# Patient Record
Sex: Male | Born: 1945 | Race: White | Hispanic: No | Marital: Married | Smoking: Former smoker
Health system: Southern US, Community
[De-identification: ages and names within clinical notes are randomized; demographics above are authoritative.]

## PROBLEM LIST (undated history)

## (undated) DIAGNOSIS — K649 Unspecified hemorrhoids: Secondary | ICD-10-CM

## (undated) DIAGNOSIS — E7800 Pure hypercholesterolemia, unspecified: Secondary | ICD-10-CM

## (undated) DIAGNOSIS — M51379 Other intervertebral disc degeneration, lumbosacral region without mention of lumbar back pain or lower extremity pain: Secondary | ICD-10-CM

## (undated) DIAGNOSIS — H9190 Unspecified hearing loss, unspecified ear: Secondary | ICD-10-CM

## (undated) DIAGNOSIS — M5137 Other intervertebral disc degeneration, lumbosacral region: Secondary | ICD-10-CM

## (undated) DIAGNOSIS — I10 Essential (primary) hypertension: Secondary | ICD-10-CM

## (undated) HISTORY — DX: Unspecified hearing loss, unspecified ear: H91.90

## (undated) HISTORY — DX: Essential (primary) hypertension: I10

## (undated) HISTORY — DX: Unspecified hemorrhoids: K64.9

## (undated) HISTORY — PX: TONSILLECTOMY: SHX5217

## (undated) HISTORY — DX: Other intervertebral disc degeneration, lumbosacral region: M51.37

## (undated) HISTORY — DX: Other intervertebral disc degeneration, lumbosacral region without mention of lumbar back pain or lower extremity pain: M51.379

## (undated) HISTORY — DX: Pure hypercholesterolemia, unspecified: E78.00

---

## 1997-07-15 HISTORY — PX: TRANSURETHRAL RESECTION OF BLADDER TUMOR: SHX2575

## 1998-03-16 ENCOUNTER — Encounter: Payer: Self-pay | Admitting: Neurosurgery

## 1998-03-16 ENCOUNTER — Ambulatory Visit (HOSPITAL_COMMUNITY): Admission: RE | Admit: 1998-03-16 | Discharge: 1998-03-16 | Payer: Self-pay | Admitting: Neurosurgery

## 1998-03-17 ENCOUNTER — Ambulatory Visit (HOSPITAL_COMMUNITY): Admission: RE | Admit: 1998-03-17 | Discharge: 1998-03-17 | Payer: Self-pay | Admitting: Neurosurgery

## 1998-03-23 ENCOUNTER — Encounter: Payer: Self-pay | Admitting: Neurosurgery

## 1998-03-23 ENCOUNTER — Ambulatory Visit (HOSPITAL_COMMUNITY): Admission: RE | Admit: 1998-03-23 | Discharge: 1998-03-23 | Payer: Self-pay | Admitting: Neurosurgery

## 1998-04-19 ENCOUNTER — Ambulatory Visit (HOSPITAL_COMMUNITY): Admission: RE | Admit: 1998-04-19 | Discharge: 1998-04-19 | Payer: Self-pay | Admitting: Gastroenterology

## 2003-12-20 ENCOUNTER — Ambulatory Visit (HOSPITAL_COMMUNITY): Admission: RE | Admit: 2003-12-20 | Discharge: 2003-12-20 | Payer: Self-pay | Admitting: Neurosurgery

## 2004-07-23 ENCOUNTER — Ambulatory Visit: Payer: Self-pay | Admitting: Cardiology

## 2004-08-13 ENCOUNTER — Ambulatory Visit: Payer: Self-pay | Admitting: Cardiology

## 2004-08-28 ENCOUNTER — Ambulatory Visit: Payer: Self-pay | Admitting: Internal Medicine

## 2005-09-10 ENCOUNTER — Ambulatory Visit: Payer: Self-pay | Admitting: Internal Medicine

## 2005-09-19 ENCOUNTER — Ambulatory Visit: Payer: Self-pay | Admitting: Internal Medicine

## 2005-10-24 ENCOUNTER — Ambulatory Visit: Payer: Self-pay | Admitting: Gastroenterology

## 2005-11-08 ENCOUNTER — Ambulatory Visit: Payer: Self-pay | Admitting: Gastroenterology

## 2006-10-31 ENCOUNTER — Inpatient Hospital Stay (HOSPITAL_COMMUNITY): Admission: EM | Admit: 2006-10-31 | Discharge: 2006-11-03 | Payer: Self-pay | Admitting: Emergency Medicine

## 2006-10-31 IMAGING — CR DG HAND COMPLETE 3+V*L*
3 series · 3 of 3 positions shown · non-contrast
Comparison: none

CLINICAL DATA: 61 year-old-male with cat bite lateral thumb. 
 LEFT HAND ? 3 VIEW:

[x hand pa left]
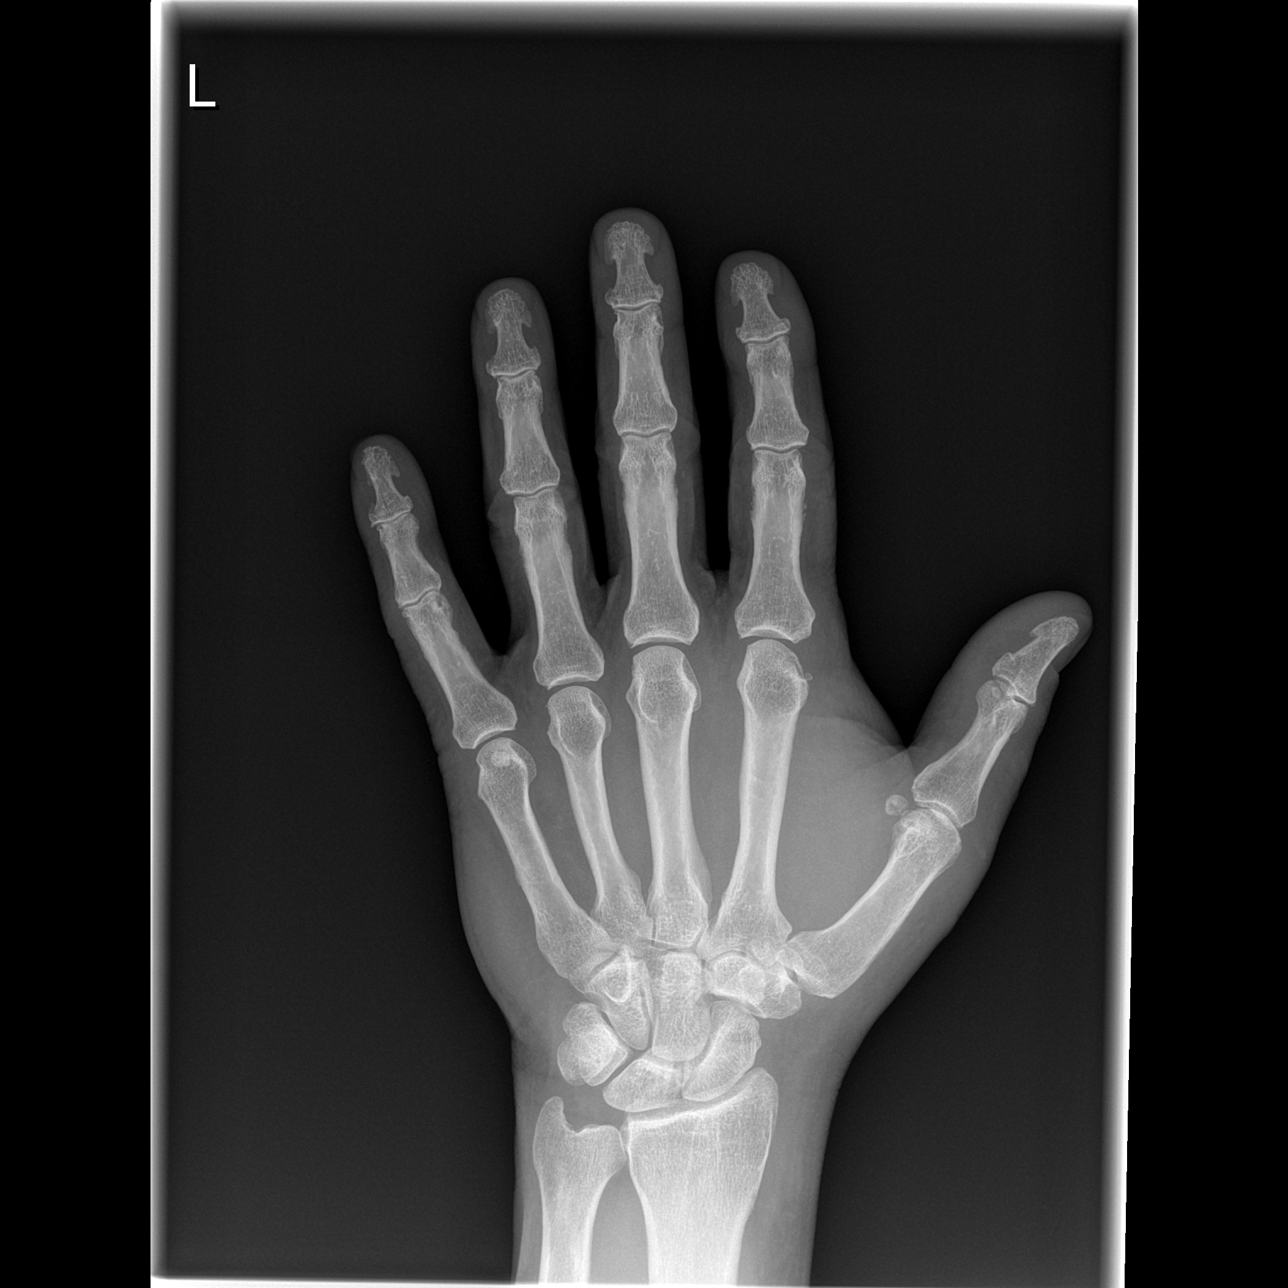

[x hand oblique left]
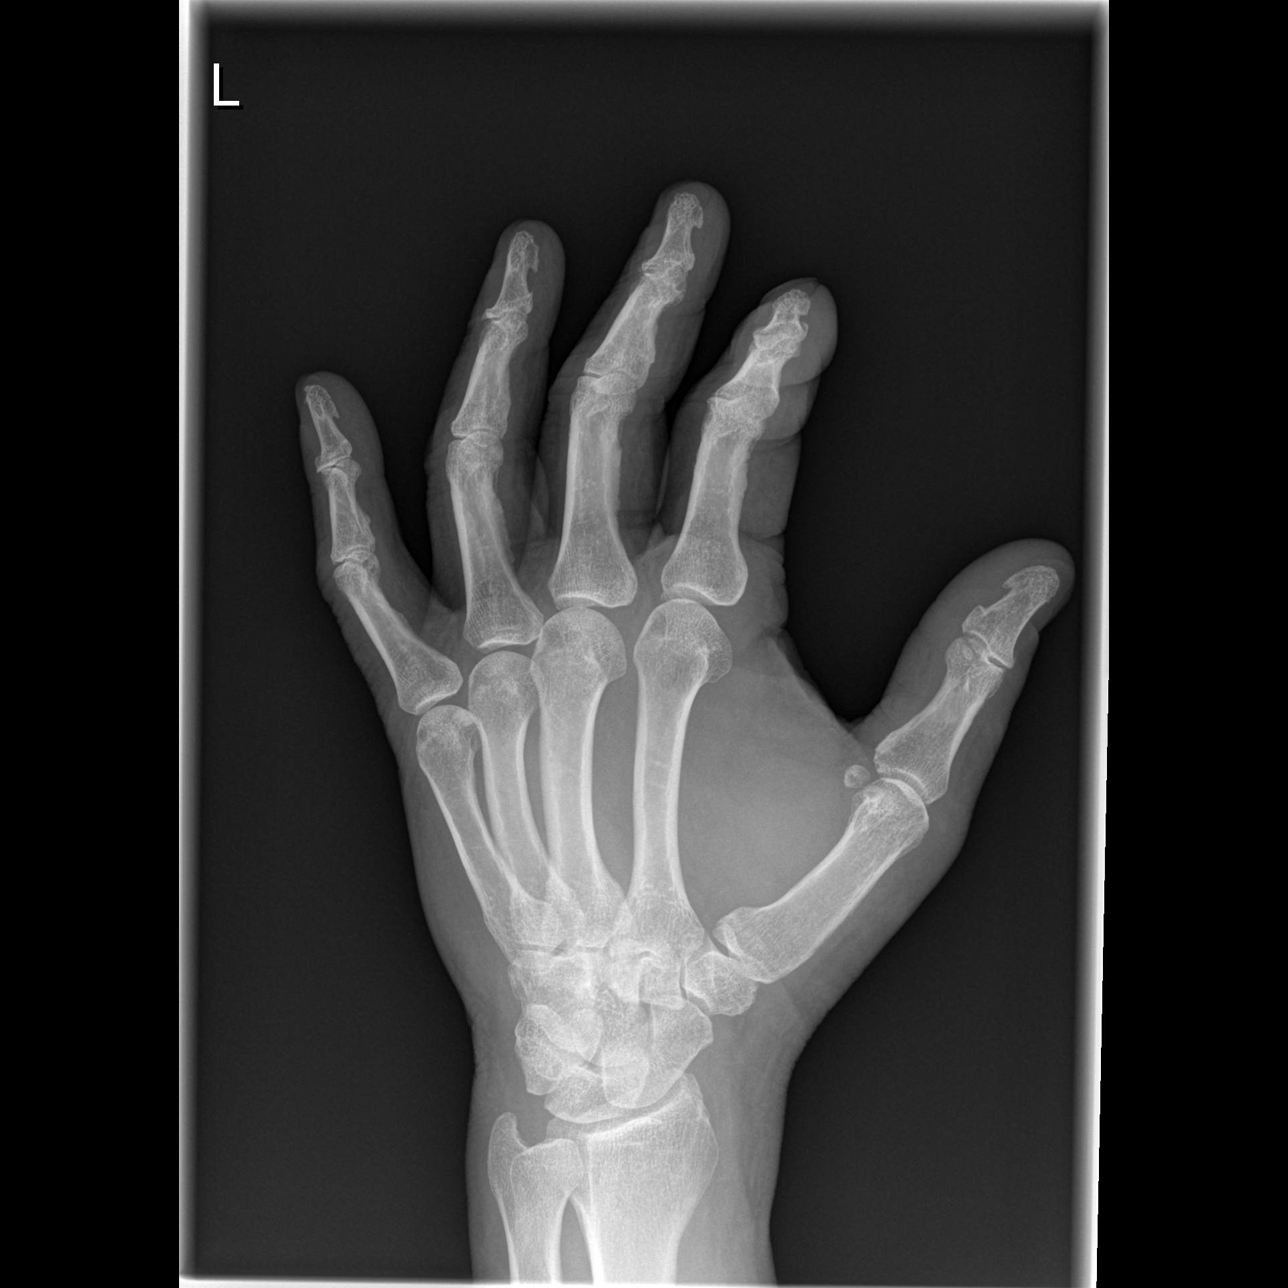

[x hand lat left]
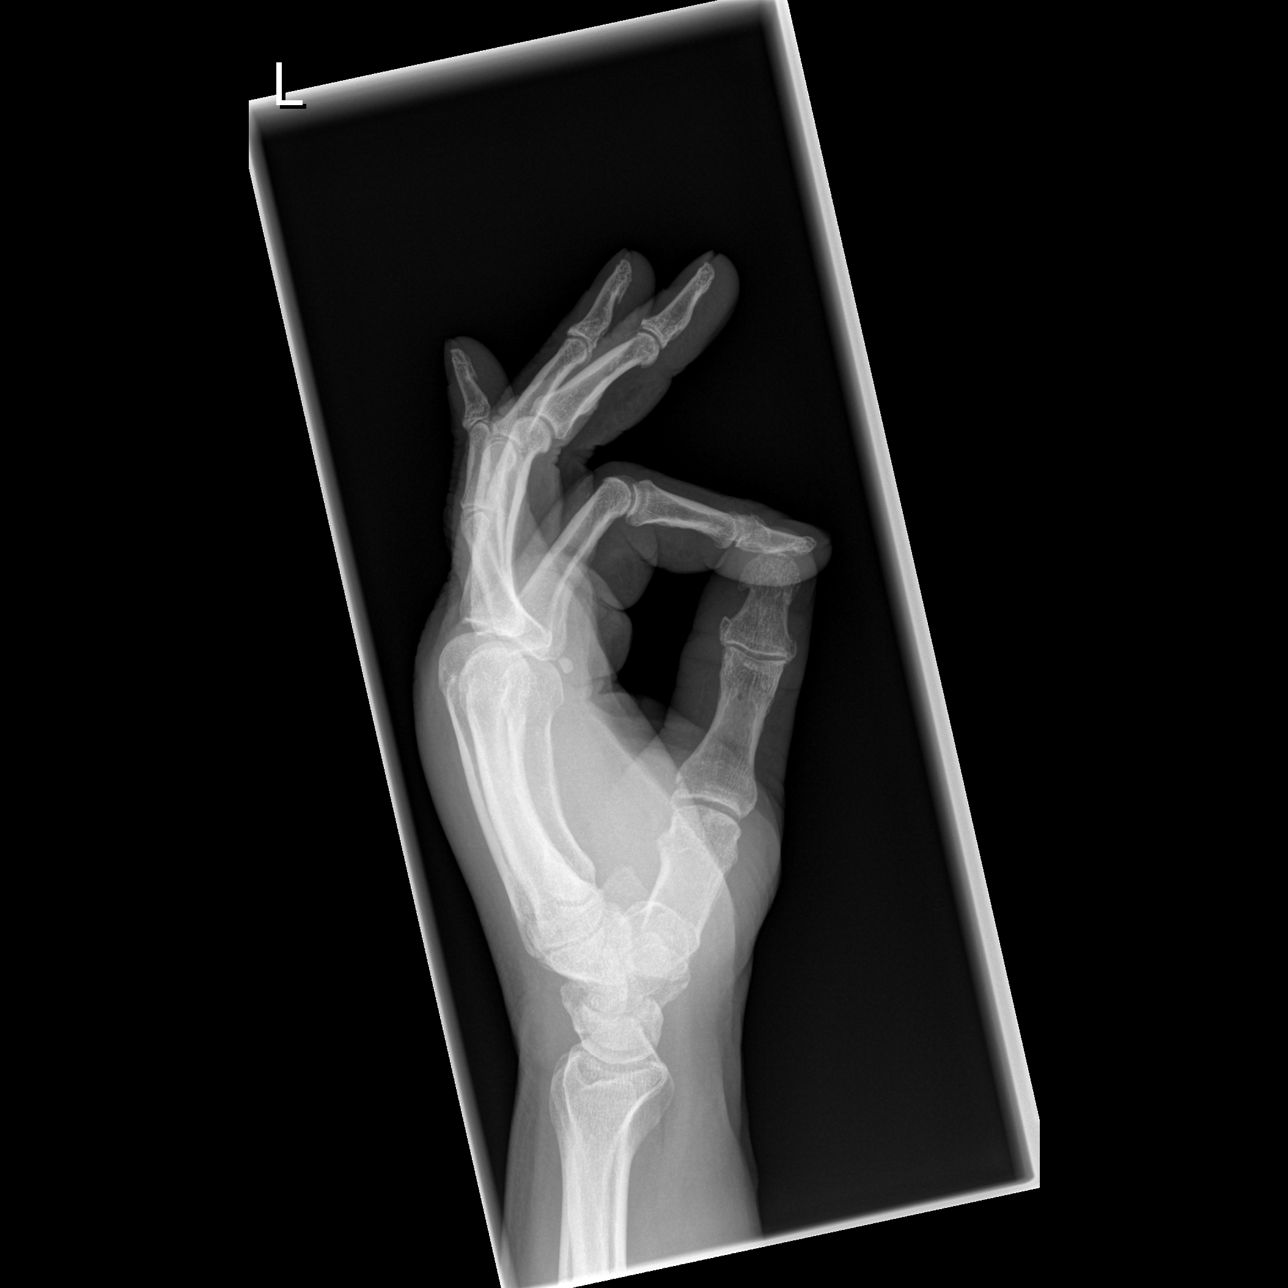

[3 of 3 positions shown; findings below may reference images not displayed]

FINDINGS: No acute bone or soft tissue abnormality is present.  There are mild degenerative changes in the DIP greater than PIP joints as well as 1st CMC joint suggestive of osteoarthritic change.
IMPRESSION: 1.  No acute abnormality. 
 2.  Mild changes of osteoarthritis.

## 2006-11-01 ENCOUNTER — Ambulatory Visit: Payer: Self-pay | Admitting: Internal Medicine

## 2006-11-07 ENCOUNTER — Ambulatory Visit: Payer: Self-pay | Admitting: Internal Medicine

## 2007-05-22 ENCOUNTER — Ambulatory Visit: Payer: Self-pay | Admitting: Cardiology

## 2007-05-22 LAB — CONVERTED CEMR LAB
AST: 34 units/L (ref 0–37)
Bilirubin, Direct: 0.2 mg/dL (ref 0.0–0.3)
HDL: 46.3 mg/dL (ref >39.0)
Total Bilirubin: 1.2 mg/dL (ref 0.3–1.2)
Total Protein: 7.3 g/dL (ref 6.0–8.3)
Triglycerides: 66 mg/dL (ref 0–149)

## 2007-05-29 ENCOUNTER — Ambulatory Visit: Payer: Self-pay | Admitting: Cardiology

## 2007-11-05 ENCOUNTER — Ambulatory Visit: Payer: Self-pay | Admitting: Cardiology

## 2007-11-05 LAB — CONVERTED CEMR LAB
ALT: 28 units/L (ref 0–53)
Alkaline Phosphatase: 67 units/L (ref 39–117)
Bilirubin, Direct: 0.1 mg/dL (ref 0.0–0.3)
Cholesterol: 132 mg/dL (ref 0–200)
Total Protein: 6.9 g/dL (ref 6.0–8.3)

## 2007-11-11 ENCOUNTER — Ambulatory Visit: Payer: Self-pay | Admitting: Cardiology

## 2008-02-12 ENCOUNTER — Encounter: Payer: Self-pay | Admitting: Internal Medicine

## 2008-02-12 ENCOUNTER — Encounter: Admission: RE | Admit: 2008-02-12 | Discharge: 2008-02-12 | Payer: Self-pay | Admitting: Neurosurgery

## 2008-02-15 ENCOUNTER — Encounter: Admission: RE | Admit: 2008-02-15 | Discharge: 2008-02-15 | Payer: Self-pay | Admitting: Neurosurgery

## 2008-02-23 ENCOUNTER — Encounter: Payer: Self-pay | Admitting: Internal Medicine

## 2008-03-08 ENCOUNTER — Encounter: Payer: Self-pay | Admitting: Internal Medicine

## 2008-03-09 ENCOUNTER — Encounter: Payer: Self-pay | Admitting: Internal Medicine

## 2009-03-25 DIAGNOSIS — E7800 Pure hypercholesterolemia, unspecified: Secondary | ICD-10-CM

## 2009-03-25 DIAGNOSIS — I10 Essential (primary) hypertension: Secondary | ICD-10-CM | POA: Insufficient documentation

## 2009-03-25 DIAGNOSIS — H9190 Unspecified hearing loss, unspecified ear: Secondary | ICD-10-CM

## 2009-04-07 ENCOUNTER — Telehealth: Payer: Self-pay | Admitting: Cardiology

## 2009-04-10 ENCOUNTER — Ambulatory Visit: Payer: Self-pay | Admitting: Cardiology

## 2009-04-11 ENCOUNTER — Ambulatory Visit: Payer: Self-pay | Admitting: Cardiology

## 2009-04-12 LAB — CONVERTED CEMR LAB
AST: 23 units/L (ref 0–37)
Alkaline Phosphatase: 77 units/L (ref 39–117)
Total Bilirubin: 0.7 mg/dL (ref 0.3–1.2)
Total CHOL/HDL Ratio: 3

## 2009-05-02 ENCOUNTER — Ambulatory Visit: Payer: Self-pay | Admitting: Internal Medicine

## 2009-05-02 LAB — CONVERTED CEMR LAB
Basophils Absolute: 0 10*3/uL (ref 0.0–0.1)
Bilirubin Urine: NEGATIVE
CO2: 28 meq/L (ref 19–32)
Calcium: 9 mg/dL (ref 8.4–10.5)
Eosinophils Absolute: 0.2 10*3/uL (ref 0.0–0.7)
Glucose, Bld: 109 mg/dL — ABNORMAL HIGH (ref 70–99)
Hemoglobin, Urine: NEGATIVE
Hemoglobin: 15.8 g/dL (ref 13.0–17.0)
Ketones, ur: NEGATIVE mg/dL
Lymphocytes Relative: 30.1 % (ref 12.0–46.0)
Lymphs Abs: 2 10*3/uL (ref 0.7–4.0)
MCHC: 33.5 g/dL (ref 30.0–36.0)
MCV: 86.9 fL (ref 78.0–100.0)
Monocytes Absolute: 0.6 10*3/uL (ref 0.1–1.0)
Neutro Abs: 3.9 10*3/uL (ref 1.4–7.7)
RDW: 13.5 % (ref 11.5–14.6)
Sodium: 142 meq/L (ref 135–145)
Total Protein, Urine: NEGATIVE mg/dL
Urine Glucose: NEGATIVE mg/dL

## 2009-05-05 ENCOUNTER — Ambulatory Visit: Payer: Self-pay | Admitting: Internal Medicine

## 2009-05-05 DIAGNOSIS — M5137 Other intervertebral disc degeneration, lumbosacral region: Secondary | ICD-10-CM

## 2009-10-10 ENCOUNTER — Telehealth (INDEPENDENT_AMBULATORY_CARE_PROVIDER_SITE_OTHER): Payer: Self-pay | Admitting: *Deleted

## 2009-10-11 ENCOUNTER — Encounter (INDEPENDENT_AMBULATORY_CARE_PROVIDER_SITE_OTHER): Payer: Self-pay | Admitting: *Deleted

## 2010-02-13 ENCOUNTER — Encounter: Payer: Self-pay | Admitting: Internal Medicine

## 2010-05-03 ENCOUNTER — Encounter: Payer: Self-pay | Admitting: Cardiology

## 2010-05-03 ENCOUNTER — Ambulatory Visit: Payer: Self-pay | Admitting: Cardiology

## 2010-05-04 ENCOUNTER — Ambulatory Visit: Payer: Self-pay | Admitting: Cardiology

## 2010-05-09 ENCOUNTER — Encounter: Payer: Self-pay | Admitting: Cardiology

## 2010-05-21 ENCOUNTER — Ambulatory Visit: Payer: Self-pay | Admitting: Internal Medicine

## 2010-05-21 LAB — CONVERTED CEMR LAB
ALT: 27 units/L (ref 0–53)
AST: 20 units/L (ref 0–37)
Basophils Relative: 0.4 % (ref 0.0–3.0)
Bilirubin, Direct: 0.1 mg/dL (ref 0.0–0.3)
Chloride: 108 meq/L (ref 96–112)
Cholesterol: 128 mg/dL (ref 0–200)
Eosinophils Relative: 3.7 % (ref 0.0–5.0)
GFR calc non Af Amer: 79.26 mL/min (ref >60)
HCT: 44.7 % (ref 39.0–52.0)
Hemoglobin, Urine: NEGATIVE
LDL Cholesterol: 71 mg/dL (ref 0–99)
Leukocytes, UA: NEGATIVE
Lymphs Abs: 2.3 10*3/uL (ref 0.7–4.0)
MCV: 86.5 fL (ref 78.0–100.0)
Monocytes Absolute: 0.6 10*3/uL (ref 0.1–1.0)
Monocytes Relative: 9.2 % (ref 3.0–12.0)
Neutrophils Relative %: 50.6 % (ref 43.0–77.0)
Nitrite: NEGATIVE
Platelets: 206 10*3/uL (ref 150.0–400.0)
Potassium: 5 meq/L (ref 3.5–5.1)
RBC: 5.17 M/uL (ref 4.22–5.81)
Specific Gravity, Urine: 1.02 (ref 1.000–1.030)
Total Bilirubin: 0.5 mg/dL (ref 0.3–1.2)
Total CHOL/HDL Ratio: 3
Total Protein: 6.6 g/dL (ref 6.0–8.3)
Urobilinogen, UA: 0.2 (ref 0.0–1.0)
VLDL: 12.2 mg/dL (ref 0.0–40.0)
WBC: 6.3 10*3/uL (ref 4.5–10.5)

## 2010-05-24 ENCOUNTER — Ambulatory Visit: Payer: Self-pay | Admitting: Internal Medicine

## 2010-05-30 ENCOUNTER — Telehealth: Payer: Self-pay | Admitting: Internal Medicine

## 2010-06-13 ENCOUNTER — Telehealth: Payer: Self-pay | Admitting: Internal Medicine

## 2010-06-20 ENCOUNTER — Encounter: Payer: Self-pay | Admitting: Cardiology

## 2010-06-20 ENCOUNTER — Ambulatory Visit: Payer: Self-pay | Admitting: Cardiology

## 2010-06-21 LAB — CONVERTED CEMR LAB
ALT: 28 units/L (ref 0–53)
AST: 25 units/L (ref 0–37)
Alkaline Phosphatase: 99 units/L (ref 39–117)
Bilirubin, Direct: 0.2 mg/dL (ref 0.0–0.3)
Total Bilirubin: 0.9 mg/dL (ref 0.3–1.2)

## 2010-08-12 LAB — CONVERTED CEMR LAB
ALT: 19 units/L (ref 0–53)
AST: 18 units/L (ref 0–37)
Albumin: 4.1 g/dL (ref 3.5–5.2)
Alkaline Phosphatase: 109 units/L (ref 39–117)
Calcium: 9.5 mg/dL (ref 8.4–10.5)
Creatinine, Ser: 1 mg/dL (ref 0.4–1.5)
GFR calc non Af Amer: 80.19 mL/min (ref >60)
Sodium: 141 meq/L (ref 135–145)
Total Protein: 7.2 g/dL (ref 6.0–8.3)

## 2010-08-14 NOTE — Letter (Signed)
Summary: [facility]  [facility]   Imported By: Lennie Odor 02/16/2010 16:50:31  _____________________________________________________________________  External Attachment:    Type:   Image     Comment:   External Document

## 2010-08-14 NOTE — Assessment & Plan Note (Signed)
Summary: cpx-lb   Vital Signs:  Patient profile:   65 year old male Height:      70 inches Weight:      213 pounds BMI:     30.67 O2 Sat:      97 % on Room air Temp:     97.9 degrees F oral Pulse rate:   65 / minute BP sitting:   120 / 82  (left arm) Cuff size:   large  Vitals Entered By: Bill Salinas CMA (May 24, 2010 1:11 PM)  O2 Flow:  Room air CC: pt here for cpx/ ab  Vision Screening:      Vision Comments: Eye exam done with Dr Nile Riggs march 2011. Shows early signs of glaucoma   Primary Care Provider:  Illene Regulus  CC:  pt here for cpx/ ab.  History of Present Illness: [name] presents today for his yearly physical.  He has no acute complaints at this time and feels he is doing well with his chronic conditions.  He is trying to make lifestyle changes and altering his diet and walks with a group every day.     He does complain of some sore muscles after increasing his dose of Lipitor to 40mg  (up from 20mg ).  He states that he felt the same way when he started taking LIpitor 20mg  but in a year or 2 the muscle pain resided.  He feels most of his pain in his legs but does note decreased muscle mass and some pain in his upper extremity. He is followed closely by Dr. Riley Kill.   He also has some back pain in the L5 region.  The pain stays in the area of the low back.  He denies the pain traveling anywhere else - for example he has no pain shooting down is leg.  Current Medications (verified): 1)  Multivitamins   Tabs (Multiple Vitamin) .... Take 1 Tablet By Mouth Once A Day 2)  Aspirin 81 Mg Tbec (Aspirin) .... Take 1 Tablet By Mouth Two Times A Day 3)  Lipitor 40 Mg Tabs (Atorvastatin Calcium) .... Take One Tablet By Mouth Daily. 4)  Furosemide 20 Mg Tabs (Furosemide) .Marland Kitchen.. 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 Riley Kill           NS --Dr. Jeral Fruit           GI - Dr. Arlyce Dice           GU - Dr. Retta Diones  Past Surgical History: Last updated: 03/25/2009  PAST SURGICAL HISTORY:  1. Tonsillectomy   2. 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  Family History: Last updated: 05-26-09  Mother died at age 20 of a MI.  Father had his first heart attack at age 67 and died of a cardiovascular disease.  Brother with his first attack at age 36. s/p CABG @ 72 Positive family history for hyperlipidemia, hypertension.   Family history is negative for colon cancer, lung cancer, prostate cancer, or diabetes.  Social History: Last updated: 05/26/2009 [name]   Married- 1987 2 sons-'91, '94 work- roofer/ self-employed: commercial  Two ounces of alcohol per month.  Nonsmoker, but a 15-pack year smoking history in the past.  Review of  Systems       The patient complains of decreased hearing and dyspnea on exertion.  The patient denies anorexia, fever, weight loss, weight gain, vision loss, hoarseness, chest pain, syncope, peripheral edema, prolonged cough, headaches, hemoptysis, abdominal pain, melena, hematochezia, hematuria, incontinence, muscle weakness, suspicious skin lesions, transient blindness, difficulty walking, depression, unusual weight change, abnormal bleeding, and enlarged lymph nodes.         He does complaing of increased lose stools - they are not watery just more lose than normal.  Physical Exam  General:  alert, well-developed, and well-nourished.   Head:  normocephalic and atraumatic.   Eyes:  pupils equal, pupils round, pupils reactive to light, pupils react to accomodation, and corneas and lenses clear.   Ears:  R ear normal and L ear normal.   Nose:  no external deformity, no external erythema, and no nasal discharge.   Mouth:  good  dentition, pharynx pink and moist, no erythema, and no exudates.   Neck:  supple, full ROM, no masses, no thyromegaly, no thyroid nodules or tenderness, normal carotid upstroke, no carotid bruits, and no cervical lymphadenopathy.   Chest Wall:  no deformities, no tenderness, and no masses.   Lungs:  normal respiratory effort, normal breath sounds, no crackles, and no wheezes.   Heart:  normal rate, regular rhythm, no murmur, no gallop, and no rub.   Abdomen:  soft, non-tender, normal bowel sounds, no distention, no masses, no guarding, no rigidity, no abdominal hernia, and no hepatomegaly.   Msk:  normal ROM, no joint tenderness, no joint swelling, no joint deformities, and no joint instability.   Pulses:  R radial normal, R posterior tibial normal, R carotid normal, L radial normal, L posterior tibial normal, and L carotid normal.   Extremities:  No edema, clubbing, or cyanosis Neurologic:  alert & oriented X3, cranial nerves II-XII intact, strength normal in all extremities, sensation intact to light touch, and gait normal.   Skin:  turgor normal and color normal.   Cervical Nodes:  no anterior cervical adenopathy and no posterior cervical adenopathy.   Psych:  Oriented X3, memory intact for recent and remote, normally interactive, good eye contact, not anxious appearing, and not depressed appearing.     Impression & Recommendations:  Problem # 1:  Preventive Health Care (ICD-V70.0) Mr. Swor appears to be doing well and is in good health.  He is capable of performing his ADLs and functions well in society.  He has no depressed affect.  His last EKG was Aug 2011 at his cardiologists.  He declined a flu and pneumonia vacination.  He is up to date with his eye exam (normal with early signs of glaucoma).  His labs, drawn today, were also normal. Colorectal cancer screening- patient reports he has had colonoscopy - no report in EMR, will pull paper chart for update.  Plan:  Continue home  medications.             Continue diet and exercise.  Problem # 2:  HYPERCHOLESTEROLEMIA (ICD-272.0) He had been taking Lipitor 20mg , but now that he is taking 40mg  he has experienced musckle wekness.  Because his lipid lowering medications are being managed by his cardiologist we will not adjust the does as of right now.  Crestor (which is less common to have muscle breakdown vs. other statins) is a viable option for him if cramps do not go away.  Plan:  continue home medication  Inform doctor of the side effects.  His updated medication list for this problem includes:    Lipitor 40 Mg Tabs (Atorvastatin calcium) .Marland Kitchen... Take one tablet by mouth daily.  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  Because there is no shooting sensations down a leg rediculopathy is less likely.     Plan:  continue home pain regimen.  Problem # 4:  HYPERTENSION, MILD (ICD-401.1)  His updated medication list for this problem includes:    Furosemide 20 Mg Tabs (Furosemide) .Marland Kitchen... 1 by mouth once daily  BP today: 120/82 Prior BP: 140/90 (05/03/2010)  Labs Reviewed: K+: 5.0 (05/21/2010) Creat: : 1.0 (05/21/2010)    BP is well controlled and labs are normal.  Plan - continue present meds.   Complete Medication List: 1)  Multivitamins Tabs (Multiple vitamin) .... Take 1 tablet by mouth once a day 2)  Aspirin 81 Mg Tbec (Aspirin) .... Take 1 tablet by mouth two times a day 3)  Lipitor 40 Mg Tabs (Atorvastatin calcium) .... Take one tablet by mouth daily. 4)  Furosemide 20 Mg Tabs (Furosemide) .Marland Kitchen.. 1 by mouth once daily   Patient: [name] Note: All result statuses are Final unless otherwise noted.  Tests: (1) BMP (METABOL)   Sodium                    142 mEq/L                   135-145   Potassium                 5.0 mEq/L                   3.5-5.1   Chloride                  108 mEq/L                    96-112   Carbon Dioxide            26 mEq/L                    19-32   Glucose              [H]  101 mg/dL                   16-10   BUN                       19 mg/dL                    9-60   Creatinine                1.0 mg/dL                   4.5-4.0   Calcium                   9.0 mg/dL                   9.8-11.9   GFR                       79.26 mL/min                >  60  Tests: (2) CBC Platelet w/Diff (CBCD)   White Cell Count          6.3 K/uL                    4.5-10.5   Red Cell Count            5.17 Mil/uL                 4.22-5.81   Hemoglobin                15.0 g/dL                   16.1-09.6   Hematocrit                44.7 %                      39.0-52.0   MCV                       86.5 fl                     78.0-100.0   MCHC                      33.5 g/dL                   04.5-40.9   RDW                       14.4 %                      11.5-14.6   Platelet Count            206.0 K/uL                  150.0-400.0   Neutrophil %              50.6 %                      43.0-77.0   Lymphocyte %              36.1 %                      12.0-46.0   Monocyte %                9.2 %                       3.0-12.0   Eosinophils%              3.7 %                       0.0-5.0   Basophils %               0.4 %                       0.0-3.0   Neutrophill Absolute      3.2 K/uL                    1.4-7.7   Lymphocyte Absolute       2.3 K/uL  0.7-4.0   Monocyte Absolute         0.6 K/uL                    0.1-1.0  Eosinophils, Absolute                             0.2 K/uL                    0.0-0.7   Basophils Absolute        0.0 K/uL                    0.0-0.1  Tests: (3) Hepatic/Liver Function Panel (HEPATIC)   Total Bilirubin           0.5 mg/dL                   1.6-1.0   Direct Bilirubin          0.1 mg/dL                   9.6-0.4   Alkaline Phosphatase      97 U/L                      39-117   AST                       20 U/L                       0-37   ALT                       27 U/L                      0-53   Total Protein             6.6 g/dL                    5.4-0.9   Albumin                   4.0 g/dL                    8.1-1.9  Tests: (4) TSH (TSH)   FastTSH                   1.30 uIU/mL                 0.35-5.50  Tests: (5) Lipid Panel (LIPID)   Cholesterol               128 mg/dL                   1-478     ATP III Classification            Desirable:  < 200 mg/dL                    Borderline High:  200 - 239 mg/dL               High:  > = 240 mg/dL   Triglycerides             61.0 mg/dL  0.0-149.0     Normal:  <150 mg/dL     Borderline High:  045 - 199 mg/dL   HDL                       40.98 mg/dL                 >11.91   VLDL Cholesterol          12.2 mg/dL                  4.7-82.9   LDL Cholesterol           71 mg/dL                    5-62  CHO/HDL Ratio:  CHD Risk                             3                    Men          Women     1/2 Average Risk     3.4          3.3     Average Risk          5.0          4.4     2X Average Risk          9.6          7.1     3X Average Risk          15.0          11.0                           Tests: (6) UDip Only (UDIP)   Color                     LT. YELLOW       RANGE:  Yellow;Lt. Yellow   Clarity                   CLEAR                       Clear   Specific Gravity          1.020                       1.000 - 1.030   Urine Ph                  6.0                         5.0-8.0   Protein                   NEGATIVE                    Negative   Urine Glucose             NEGATIVE                    Negative   Ketones                   NEGATIVE  Negative   Urine Bilirubin           NEGATIVE                    Negative   Blood                     NEGATIVE                    Negative   Urobilinogen              0.2                         0.0 - 1.0   Leukocyte Esterace        NEGATIVE                    Negative    Nitrite                   NEGATIVE                    Negative  Orders Added: 1)  Est. Patient 65& > [04540]

## 2010-08-14 NOTE — Miscellaneous (Signed)
Summary: update med  Clinical Lists Changes  Medications: Changed medication from LIPITOR 20 MG TABS (ATORVASTATIN CALCIUM) Take one tablet by mouth daily. to LIPITOR 40 MG TABS (ATORVASTATIN CALCIUM) Take 1/2 tablet daily

## 2010-08-14 NOTE — Progress Notes (Signed)
  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  Initial call taken by: Lamar Sprinkles, CMA,  May 30, 2010 2:00 PM  Follow-up for Phone Call        Spoke w/pt, he will drop off form.  Follow-up by: Lamar Sprinkles, CMA,  May 30, 2010 2:03 PM

## 2010-08-14 NOTE — Progress Notes (Signed)
  Phone Note Refill Request Message from:  Fax from Pharmacy on June 13, 2010 2:41 PM  Refills Requested: Medication #1:  FUROSEMIDE 20 MG TABS 1 by mouth once daily. Initial call taken by: Rock Nephew CMA,  June 13, 2010 2:41 PM    Prescriptions: FUROSEMIDE 20 MG TABS (FUROSEMIDE) 1 by mouth once daily  #30 x 12   Entered by:   Rock Nephew CMA   Authorized by:   Jacques Navy MD   Signed by:   Rock Nephew CMA on 06/13/2010   Method used:   Electronically to        [facility]* (retail)       [address]       [place], [place]  [id number]       Ph: [phone]       Fax: [phone]   RxID:   [id number]

## 2010-08-14 NOTE — Assessment & Plan Note (Signed)
Summary: F1Y   Visit Type:  1 year follow up Primary [name]:  [name]  CC:  no cardiac complaints.  History of Present Illness: Has been losing his hearing.  Doing well otherwise.  Denies chest pain.  We talked lipids for nearly thirty minutes.    Current Medications (verified): 1)  Multivitamins   Tabs (Multiple Vitamin) .... Take 1 Tablet By Mouth Once A Day 2)  Aspirin 81 Mg Tbec (Aspirin) .... Take 1 Tablet By Mouth Two Times A Day 3)  Lipitor 40 Mg Tabs (Atorvastatin Calcium) .... Take 1/2 Tablet Daily 4)  Furosemide 20 Mg Tabs (Furosemide) .Marland Kitchen.. 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 Riley Martinez           NS --Dr. Jeral Martinez           GI - Dr. Arlyce Martinez           GU - Dr. Retta Martinez  Past Surgical History: Last updated: 03/25/2009  PAST SURGICAL HISTORY:  1. Tonsillectomy   2. 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  Family History: Last updated: 05/26/09  Mother died at age 68 of a MI.  Father had his first heart attack at age 38 and died of a cardiovascular disease.  Brother with his first attack at age 41. s/p CABG @ 55 Positive family history for hyperlipidemia, hypertension.   Family history is negative for colon cancer, lung cancer, prostate cancer, or diabetes.  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  Nonsmoker, but a 15-pack year smoking history in the past.  Vital Signs:  Patient profile:   65 year old male Height:      70 inches Weight:      210.50 pounds BMI:     30.31 Pulse rate:   64 / minute Pulse rhythm:   regular Resp:     18  per minute BP sitting:   140 / 90  (left arm) Cuff size:   large  Vitals Entered By: [name] (May 03, 2010 12:51 PM)  Physical Exam  General:  Well developed, well nourished, in no acute distress. Head:  normocephalic and atraumatic Eyes:  PERRLA/EOM intact; conjunctiva and lids normal. Lungs:  Clear bilaterally to auscultation and percussion. Heart:  Normal S1 and S2.  No murmur.   Abdomen:  Bowel sounds positive; abdomen soft and non-tender without masses, organomegaly, or hernias noted. No hepatosplenomegaly. Pulses:  pulses normal in all 4 extremities Extremities:  No clubbing or cyanosis.   EKG  Procedure date:  05/03/2010  Findings:      NSR.  LAD.    Impression & Recommendations:  Problem # 1:  HYPERCHOLESTEROLEMIA (ICD-272.0) Suggested we do Liposciences to measure particle  number given family history, and borderline LDL response.   His updated medication list for this problem includes:    Lipitor 40 Mg Tabs (Atorvastatin calcium) .Marland Kitchen... Take 1/2 tablet daily  Orders: EKG w/ Interpretation (93000)  Problem # 2:  HYPERTENSION, MILD (ICD-401.1) on furosemide 20mg .  will check K His updated medication list for this problem includes:    Aspirin 81 Mg Tbec (Aspirin) .Marland Kitchen... Take 1  tablet by mouth two times a day    Furosemide 20 Mg Tabs (Furosemide) .Marland Kitchen... 1 by mouth once daily  Orders: EKG w/ Interpretation (93000)  Problem # 3:  HEARING LOSS (ICD-389.9) suggest he consider hearing aids.   Patient Instructions: 1)  Your physician recommends that you return for a FASTING BMP, LIVER  and LIPOMED Profile--Nothing to eat or drink after midnight (272.0, 401.9)  2)  Your physician recommends that you continue on your current medications as directed. Please refer to the Current Medication list given to you today. 3)  Your physician wants you to follow-up in:  1 YEAR.  You will receive a reminder letter in the mail two months in advance. If you don't receive a  letter, please call our office to schedule the follow-up appointment.  Appended Document: F1Y His LDL is at target, but LDL P remains high.  Given family history, I would be inclined to increase Lipitor to 40mg  if he can tolerate, and repeat lipomed and liver profile in six weeks.  We will see if his particles decline.  TS  Appended Document: F1Y Pt's wife aware of results by phone.  The pt will increase Lipitor to 40mg  daily and have follow-up labs 06/20/10.

## 2010-08-14 NOTE — Progress Notes (Signed)
Summary: question/ refill - lipitors  Phone Note Call from Patient Call back at Home Phone [phone] Call back at Work Phone [phone] Message from:  Patient on October 10, 2009 8:13 AM  Refills Requested: Medication #1:  LIPITOR 20 MG TABS Take one tablet by mouth daily. Caller: Patient Reason for Call: Talk to Nurse Summary of Call: per pt calling pt stating that he gets for years 40 mg of lipitor an break in [facility]. [facility] [phone] Initial call taken by: Lorne Skeens,  October 10, 2009 8:15 AM  Follow-up for Phone Call        Rx faxed to pharmacy for 40mg  Follow-up by: Vikki Ports,  October 11, 2009 4:28 PM

## 2010-08-14 NOTE — Miscellaneous (Signed)
Summary: Increase Lipitor  Clinical Lists Changes  Medications: Changed medication from LIPITOR 40 MG TABS (ATORVASTATIN CALCIUM) Take 1/2 tablet daily to LIPITOR 40 MG TABS (ATORVASTATIN CALCIUM) Take one tablet by mouth daily.

## 2010-10-16 ENCOUNTER — Other Ambulatory Visit: Payer: Self-pay | Admitting: *Deleted

## 2010-10-16 MED ORDER — ATORVASTATIN CALCIUM 40 MG PO TABS: 40.0000 mg | ORAL_TABLET | Freq: Every day | ORAL | Status: DC

## 2010-11-27 NOTE — Letter (Signed)
May 29, 2007    Rosalyn Gess. Norins, MD  [address]  [place], [place] [address]   RE:  [name], [name]  MRN:  [id number]  /  DOB:  [birth year]   Dear Kathlene November:   I had the pleasure of seeing Kailon Treese in the office today in  followup.  He has generally been stable, he denies any ongoing chest  pain.  He does get a little short of breath unfortunately, he exercises  not at all, and his weight has gone up about 15 pounds in the last few  months.  He says he eats a lot and does very little exercise.  He denies  chest pain.  He does get some shortness of breath when he enters the top  of the stairs.  He also went by and had a lipid profile done which  revealed an LDL of 93 and a cholesterol of 152 with an HDL of 46 on 20  mg of Lipitor.  His liver function studies were within normal limits.   He feels well otherwise.   Today on examination, initially the blood pressure was 140/100 and the  pulse was 72.  Recheck by me revealed a blood pressure of 140/90, which  is consistent with what he has had previously.  His weight is 220  pounds.  He otherwise appears well.  There were no carotid bruits.  LUNGS:  Clear to auscultation and percussion.  CARDIAC EXAM:  Regular without a murmur.  ABDOMEN:  Soft.  EXTREMITIES:  No edema.  There were no carotid bruits.   The electrocardiogram demonstrates normal sinus rhythm.  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   We had a long discussion today.  I have recommended a regular exercise  program.  I have also suggested that we do stress testing in about 3  months.  As you know, he has an extraordinarily strong family history.  I have also suggested that his lipid profile be rechecked again in 3  months  accompanied by a better diet, and with regular exercise therapy.  We  also agreed that if he remained both hypertensive and  hypercholesterolemic, that these would need to be addressed.  Would  certainly defer to your recommendations.  I  appreciate the opportunity  of sharing in his care.    Sincerely,      Arturo Morton. Riley Kill, MD, [facility]  Electronically Signed    TDS/MedQ  DD: 05/29/2007  DT: 05/29/2007  Job #: [id number]

## 2010-11-27 NOTE — Procedures (Signed)
[facility]                              EXERCISE TREADMILL   GEMINI, BEAUMIER                       MRN:          [id number]  DATE:11/11/2007                            DOB:          [birth year]    HISTORY OF PRESENT ILLNESS:  [name] is a 65 year old gentleman well-  known to me who has a very strong family history of coronary artery  disease.  He has hypercholesterolemia on lipid lowering therapy.  Exercise tolerance testing was recommended.  He has mild hypertension.   EXERCISE TOLERANCE TEST:  Duration of excise 12 minutes.  Maximum heart  rate 153%.  PMHR 98%.  Net level 13.4.   The patient exercised today on the Bruce protocol.  Exercise tolerance  was excellent.  He experienced no chest pain.  The test was terminated  due to fatigue.  Exercise electrocardiogram demonstrates normal sinus  rhythm with left axis deviation.  Maximum stress:  There was absolutely  no significant ST depression, and no significant ventricular ectopy.  The study was felt to be negative for inducible ischemia and an  excellent level of exercise.  He did have a fair amount of shortness of  breath at the end which he claims is related to chemical exposure in the  past.  He easily resolved, however, within 2-3 minutes.   Under risk summation, the patient has been stable.  He needs to exercise  more.  His overall activity level has not been as good as it could be.  His blood pressure is elevated.  I have instructed him to take his blood  pressures, go to see Dr. Illene Regulus in follow-up in about 6 weeks  with a log of what his blood pressures were running.  His weight has  been up, and the patient may need therapy for hypertension.  I have  strongly encouraged him to try to a bring his weight down in order to  control his blood pressure.  We will see how he does over the next 6  weeks, when a follow-up with Dr. Debby Bud will be recommended.   Also, his most recent  cholesterol is 132 with an LDL of 82 and an HDL of  42.  He is near target.  His liver functions are unremarkable.  He will  continue on the same medical regimen.     Arturo Morton. Riley Kill, MD, [facility]  Electronically Signed    TDS/MedQ  DD: 11/11/2007  DT: 11/11/2007  Job #: [id number]

## 2010-11-27 NOTE — Letter (Signed)
November 11, 2007    Rosalyn Gess. Norins, MD  [address]  [place], [place] [address]   RE:  [name], [name]  MRN:  [id number]  /  DOB:  [birth year]   Dear Kathlene November:   I did an exercise tolerance test on Clarene Essex; he did extremely well.  He completed 12 minutes on the Bruce protocol, without EKG changes or  chest pain.  As you know, he has a strong family history of coronary  artery disease and hypercholesterolemia, and both of these are under  control.  What is not under good control is the fact that he does have  elevated blood pressures at rest, this is coupled with modest obesity.  His blood pressure today at rest was 140/93.  My thinking is that this  might need to be treated.  He does have a blood pressure cuff at home,  which he has not been using.  He is going to start measuring blood  pressure at all different times during the day.  I have asked him to  keep a log of this, and return to see you in follow-up in about 6 weeks.  We will see him back in follow-up in one year, but I have encouraged him  to follow up with you to determine whether treatment might be needed for  his hypertension.  Thanks for allowing me to share in his care.    Sincerely,      Arturo Morton. Riley Kill, MD, [facility]  Electronically Signed    TDS/MedQ  DD: 11/11/2007  DT: 11/11/2007  Job #: [id number]

## 2010-11-30 NOTE — Op Note (Signed)
NAME:  [name], [name]                          ACCOUNT NO.:  [id number]   MEDICAL RECORD NO.:  [id number]                   PATIENT TYPE:  OIB   LOCATION:  2890                                 FACILITY:  [facility]   PHYSICIAN:  Hilda Lias, M.D.                DATE OF BIRTH:  1945-08-01   DATE OF PROCEDURE:  12/20/2003  DATE OF DISCHARGE:  12/20/2003                                 OPERATIVE REPORT   PREOPERATIVE DIAGNOSIS:  Bilateral carpal tunnel syndrome, left worse than  right with atrophy.   POSTOPERATIVE DIAGNOSIS:  Bilateral carpal tunnel syndrome, left worse than  right with atrophy.   PROCEDURE:  Decompression of the left median nerve.   SURGEON:  Hilda Lias, M.D.   CLINICAL HISTORY:  Mr. Godek is a 65 year old gentleman complaining of  weakness, numbness, and atrophy of both hands, left worse than the right.  Electrodiagnostic tests was highly positive for bilateral carpal tunnel  syndrome.  Although the right side electrically was worse, he wanted to  proceed with the left one because that is where he had more pain and  atrophy.  The risks were explained including the possibility of no  improvement, need for further surgery, infection.   PROCEDURE:  The patient was taken to the OR and after IV sedation, the hand  was prepped with Betadine.  Drapes were applied.  Infiltration along the  base of the thumb was made.  An incision was following the base of the thumb  through the skin, volar ligament, and through a thick, calcified carpal  ligament was made.  Decompression proximal and distal along the ulnar aspect  of the nerve was done.  At the end, the nerve was found to be flat and pale.  Having a good decompression, the area was irrigated, hemostasis was done  with bipolar.  The wound was closed with nylon.  The patient will be going  home once he is stable.  He will be seen in my office in 24 to 48 hours.                                               Hilda Lias, M.D.    EB/MEDQ  D:  12/20/2003  T:  12/20/2003  Job:  [id number]

## 2010-11-30 NOTE — Discharge Summary (Signed)
NAMEDHANI, [name]                ACCOUNT NO.:  [id number]   MEDICAL RECORD NO.:  [id number]          PATIENT TYPE:  INP   LOCATION:  5011                         FACILITY:  [facility]   PHYSICIAN:  Rosalyn Gess. Norins, MD  DATE OF BIRTH:  [birth year]   DATE OF ADMISSION:  10/31/2006  DATE OF DISCHARGE:                               DISCHARGE SUMMARY   ADMITTING DIAGNOSIS:  Cellulitis secondary to cat bite.   DISCHARGE DIAGNOSIS:  Cellulitis secondary to cat bite.   CONSULTANTS:  None.   PROCEDURES:  Chest x-ray October 31, 2006, which showed mild degenerative  changes in the thoracic spine with no acute cardiopulmonary changes.  Twelve-lead electrocardiogram which was unremarkable except left  anterior fascicular block.   HISTORY OF PRESENT ILLNESS:  [name] is a healthy 65 year old gentleman  who ran over his cat by accident.  When he went to rescue the cat he was  bitten multiple times with full fang depth to the base of his left  thumb.  He did have erythema and swelling.  Because of persistent  swelling he came to [facility] emergency department, at that time was  noted to have rising, ascending erythema and was admitted for IV  antibiotics.   HOSPITAL COURSE:  The patient was continued on Unasyn 3 g IV q.6h.  On  this regimen he remained afebrile.  He had decreased erythema and  swelling of his left hand.  The ascending erythema also resolved.  With  the patient having a good response to antibiotic therapy, at this point  he is now felt to be stable for discharge in the a.m. and to continue  antibiotic therapy using Augmentin 875 mg b.i.d.   DISCHARGE EXAMINATION:  The patient is afebrile, vital signs are stable.  Left hand is mildly erythematous, with 1+ swelling, minimal tenderness.   DISCHARGE MEDICATIONS:  The patient will continue Augmentin 875 b.i.d.  as noted.   FOLLOWUP:  The patient is to be seen in the office on Friday, April 25.   CONDITION AT TIME OF DISCHARGE  DICTATION:  Stable and improved.      Rosalyn Gess Norins, MD  Electronically Signed     MEN/MEDQ  D:  11/02/2006  T:  11/03/2006  Job:  [id number]

## 2010-11-30 NOTE — H&P (Signed)
[name], [name]                ACCOUNT NO.:  [id number]   MEDICAL RECORD NO.:  [id number]          PATIENT TYPE:  INP   LOCATION:  5011                         FACILITY:  [facility]   PHYSICIAN:  Rosalyn Gess. Norins, MD  DATE OF BIRTH:  Mar 30, 1946   DATE OF ADMISSION:  10/31/2006  DATE OF DISCHARGE:                              HISTORY & PHYSICAL   CHIEF COMPLAINT:  Swollen left hand.   HISTORY OF PRESENT ILLNESS:  [name] is a healthy 65 year old Caucasian  gentleman who unfortunately ran over his cat, which he had for 18 years.  He went to rescue the cat after injury and was bit multiple times to the  base of his left thumb.  He developed some erythema and swelling.  He  was seen at an urgent care center and was given Rocephin.  The patient  continued to have progressive swelling, it was creeping up his arm who  presents to [facility] Emergency Department.  He is now admitted with  puncture wound cellulitis of the left hand for IV antibiotics.   PAST SURGICAL HISTORY:  1. Tonsillectomy remote.  2. 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   PAST MEDICAL HISTORY:  1. Usual childhood disease.  2. Hyperlipidemia.  3. Hematuria secondary to bladder polyp.  4. Hearing loss.  5. Prostatism with nocturia x2-3 with decreased force of stream.   FAMILY HISTORY:  Mother died at age 105 of a MI.  Father had his first  heart attack at age 34 and died of a cardiovascular disease.  Brother  with his first attack at age 29.  Positive family history for  hyperlipidemia, hypertension.  Family history is negative for colon  cancer, lung cancer, prostate cancer, or diabetes.   SOCIAL HISTORY:  The patient has a graduate degree in biochemistry, but  he has all but dissertation.  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  He has been married for 22 years.  He has a 60-  year-old son  and a 64 year old son.  The patient is an avid reader and  medically statisticated and informed.   HABITS:  Two ounces of alcohol per month.  Nonsmoker, but a 25-pack year  smoking history in the past.   EXAMINATION:  VITAL SIGNS:  Temperature was 98.1, blood pressure 148/88,  heart rate was 80, respirations 20, O2 sat 99% on room air.  GENERAL APPEARANCE:  This is a well-developed, well-nourished Caucasian  male in no acute distress.  HEENT:  Unremarkable.  NECK:  Supple.  CHEST:  Clear with no rales, wheezes, or rhonchi.  CARDIOVASCULAR:  He had 2+ radial pulse, he had a quiet pericardium with  regular rate and rhythm without murmurs, rubs, or gallop.  ABDOMEN:  Nontender.  GENITALIA/RECTAL EXAM:  Deferred.  EXTREMITIES:  The patient's left hand is markedly swollen and  erythematous.  There is an ink mark around what had been the original  wound is now erythematous extending beyond this ink mark as well as red  streaking running up his forearm.  All other extremities are normal.  NEUROLOGIC EXAM:  Nonfocal.   LABORATORY DATA:  Hemoglobin was 14.1 grams, white count 12,100 with 78%  segs, 14% lymphs, 8% monos.  Chemistries were unremarkable with a  creatinine of 1.03, BUN of 14.   ASSESSMENT AND PLAN:  1. Puncture wound cellulitis from cat bite, left hand.  The patient      was admitted for IV Unasyn 3 grams IV q.6 hours.  We will continue      IV antibiotics until erythema is improving, swelling is improving.      The patient will be seen in consultation by Dr. Mina Marble for hand      surgery.  2. Hyperlipidemia.  The patient will continue on his Lipitor in the      hospital.  We will get a morning fasting lipid panel.      Rosalyn Gess Norins, MD  Electronically Signed     MEN/MEDQ  D:  10/31/2006  T:  11/01/2006  Job:  [id number]

## 2010-11-30 NOTE — Discharge Summary (Signed)
[name], [name]                ACCOUNT NO.:  [id number]   MEDICAL RECORD NO.:  [id number]          PATIENT TYPE:  INP   LOCATION:  5011                         FACILITY:  [facility]   PHYSICIAN:  Gordy Savers, MDDATE OF BIRTH:  [birth year]   DATE OF ADMISSION:  11/01/2006  DATE OF DISCHARGE:  11/02/2006                               DISCHARGE SUMMARY   FINAL DIAGNOSIS:  Cat bite left hand with cellulitis.   ADDITIONAL DIAGNOSIS:  Hyperlipidemia.   DISCHARGE MEDICATIONS:  1. Aspirin 81 mg daily.  2. Lipitor 20 mg daily.  3. Augmentin 875 b.i.d. for 7additional days.   HISTORY OF PRESENT ILLNESS:  This patient is a healthy 65 year old  gentleman who was bitten by his domestic cat after attempting to rescue  the cat following an injury.  The patient was initially seen at urgent  care and given a shot of Rocephin.  Due to progressive pain and swelling  and erythema with some lymphangitis, he presented to the [facility] emergency  department for evaluation.  He was subsequently admitted for parenteral  IV antibiotics for his left hand cellulitis secondary to the cat bite.   LABORATORY DATA AND HOSPITAL COURSE:  The patient was admitted to the  general hospital floor where he received antibiotic therapy with Unasyn  3 grams IV every 6 hours.  Initial white count was 12.1 and on the  following day improved to 10.1.  Hospital course was marked by steady  improvement of the swelling, erythema and pain.  At the time of  discharge, he had several small crusted puncture lesions that were not  erythematous or draining.  He had minimal erythema and soft tissue  swelling.   DISPOSITION:  The patient was discharged today to complete 7 additional  days of Augmentin 875 b.i.d.  He will attempt to keep his hand elevated  as much as possible.  Local wound care discussed.  He has also been  asked to follow up with his primary care physician within the next 48  hours.  He will report any worsening  pain, swelling or discharge.      Gordy Savers, MD  Electronically Signed     PFK/MEDQ  D:  11/02/2006  T:  11/02/2006  Job:  [id number]

## 2011-08-05 ENCOUNTER — Ambulatory Visit (INDEPENDENT_AMBULATORY_CARE_PROVIDER_SITE_OTHER): Payer: BC Managed Care – PPO

## 2011-08-05 DIAGNOSIS — H5789 Other specified disorders of eye and adnexa: Secondary | ICD-10-CM

## 2011-08-05 DIAGNOSIS — H01119 Allergic dermatitis of unspecified eye, unspecified eyelid: Secondary | ICD-10-CM

## 2011-08-20 ENCOUNTER — Other Ambulatory Visit: Payer: Self-pay | Admitting: Internal Medicine

## 2011-08-20 ENCOUNTER — Encounter: Payer: Self-pay | Admitting: Internal Medicine

## 2011-09-18 ENCOUNTER — Telehealth: Payer: Self-pay | Admitting: Internal Medicine

## 2011-09-18 DIAGNOSIS — I10 Essential (primary) hypertension: Secondary | ICD-10-CM

## 2011-09-18 DIAGNOSIS — E7800 Pure hypercholesterolemia, unspecified: Secondary | ICD-10-CM

## 2011-09-18 NOTE — Telephone Encounter (Signed)
Cannot guarantee [facility] payment for labs prior to visit. Orders entered for lipids, liver and general chemistry

## 2011-09-18 NOTE — Telephone Encounter (Signed)
The pt's wife called and is requesting labs be done before the appointment.  She stated the appointment would be "pointless" without the labs.  Thanks!

## 2011-09-26 ENCOUNTER — Encounter: Payer: Self-pay | Admitting: Internal Medicine

## 2011-09-26 ENCOUNTER — Ambulatory Visit (INDEPENDENT_AMBULATORY_CARE_PROVIDER_SITE_OTHER): Payer: BC Managed Care – PPO | Admitting: Internal Medicine

## 2011-09-26 VITALS — BP 122/80 | HR 70 | Temp 96.5°F | Resp 16 | Ht 70.0 in | Wt 201.8 lb

## 2011-09-26 DIAGNOSIS — M6250 Muscle wasting and atrophy, not elsewhere classified, unspecified site: Secondary | ICD-10-CM

## 2011-09-26 DIAGNOSIS — E7800 Pure hypercholesterolemia, unspecified: Secondary | ICD-10-CM

## 2011-09-26 DIAGNOSIS — M51379 Other intervertebral disc degeneration, lumbosacral region without mention of lumbar back pain or lower extremity pain: Secondary | ICD-10-CM

## 2011-09-26 DIAGNOSIS — M5137 Other intervertebral disc degeneration, lumbosacral region: Secondary | ICD-10-CM

## 2011-09-26 DIAGNOSIS — C679 Malignant neoplasm of bladder, unspecified: Secondary | ICD-10-CM

## 2011-09-26 DIAGNOSIS — I10 Essential (primary) hypertension: Secondary | ICD-10-CM

## 2011-09-26 DIAGNOSIS — Z0000 Encounter for general adult medical examination without abnormal findings: Secondary | ICD-10-CM

## 2011-09-26 NOTE — Progress Notes (Signed)
Subjective:    Patient ID: [name], male    DOB: [birth year], 66 y.o.   MRN: [id number]  HPI The patient is here for annual wellness examination and management of other chronic and acute problems. He is feeling well with no new medical complaints. He is having some neck pain but no radicular symptoms. No major illness, no surgery and no injury since his last visit.    The risk factors are reflected in the social history. Discussed motorcycle riding as a risk factor.  The roster of all physicians providing medical care to patient - is listed in the Snapshot section of the chart.  Activities of daily living:  The patient is 100% inedpendent in all ADLs: dressing, toileting, feeding as well as independent mobility  Home safety : The patient has smoke detectors in the home. Fall risk - low insight into his risk. Rides a motorcycle at high speeds.  They wear seatbelts. No firearms at home. There is no violence in the home.   There is no risks for hepatitis, STDs or HIV. There is no  history of blood transfusion. They have no travel history to infectious disease endemic areas of the world.  The patient has seen their dentist in the last six month. They have seen their eye doctor in the last year. They admit to hearing difficulty and have not had audiologic testing in the last year.  They do not  have excessive sun exposure. Discussed the need for sun protection: hats, long sleeves and use of sunscreen if there is significant sun exposure.   Diet: the importance of a healthy diet is discussed. They do have a healthy diet.  The patient has a regular exercise program: walking, 60 duration, 3 per week.  The benefits of regular aerobic exercise were discussed.  Depression screen: there are no signs or vegative symptoms of depression- irritability, change in appetite, anhedonia, sadness/tearfullness.  Cognitive assessment: the patient manages all their financial and personal affairs and is actively  engaged.   The following portions of the patient's history were reviewed and updated as appropriate: allergies, current medications, past family history, past medical history,  past surgical history, past social history  and problem list.  Vision, hearing, body mass index were assessed and reviewed.   During the course of the visit the patient was educated and counseled about appropriate screening and preventive services including : fall prevention , diabetes screening, nutrition counseling, colorectal cancer screening, and recommended immunizations.  Past Medical History  Diagnosis Date  . Degeneration of lumbar or lumbosacral intervertebral disc   . Hypertension     mild  . Pure hypercholesterolemia   . Unspecified hearing loss    Past Surgical History  Procedure Date  . Tonsillectomy   . Transurethral resection of bladder tumor 1999    benign polyp   Family History  Problem Relation Age of Onset  . Heart attack Mother     age 53  . Heart disease Mother     CHF  . Hyperlipidemia Mother   . Heart attack Father 45    died of cardiovascular diease  . Heart disease Father     CAD/MI  . Heart attack Brother 21    S/P CABG @ 28  . Heart disease Brother     CAD/MI@21 , CABG @ 28  . Colon cancer Neg Hx   . Lung cancer Neg Hx   . Prostate cancer Neg Hx   . Diabetes Neg Hx   .  Cancer Neg Hx   . Hyperlipidemia Other   . Hypertension Other    History   Social History  . Marital Status: Married    Spouse Name: N/A    Number of Children: 2  . Years of Education: N/A   Occupational History  . Systems [employer]   Social History Main Topics  . Smoking status: Former Smoker -- 15 years    Types: Cigarettes  . Smokeless tobacco: Never Used  . Alcohol Use: 0.0 oz/week     Two ounces of alcohol per month  . Drug Use: No  . Sexually Active: Yes -- Male partner(s)   Other Topics Concern  . Not on file   Social History Narrative   [employer],  [employer]-undergrad. Married- 1987. 2 sons-'91 [employer], '94-headed for [employer]. 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      Review of Systems Constitutional:  Negative for fever, chills, activity change and unexpected weight change.  HEENT:  Negative for hearing loss, ear pain, congestion, neck stiffness and postnasal drip. Negative for sore throat or swallowing problems. Negative for dental complaints.   Eyes: Negative for vision loss or change in visual acuity.  Respiratory: Negative for chest tightness and wheezing. Negative for DOE.   Cardiovascular: Negative for chest pain or palpitations. No decreased exercise tolerance Gastrointestinal: No change in bowel habit. No bloating or gas. No reflux or indigestion Genitourinary: Negative for urgency, frequency, flank pain and difficulty urinating.  Musculoskeletal: Negative for myalgias, back pain, arthralgias and gait problem.  Neurological: Negative for dizziness, tremors, weakness and headaches.  Hematological: Negative for adenopathy.  Psychiatric/Behavioral: Negative for behavioral problems and dysphoric mood.       Objective:   Physical Exam Filed Vitals:   09/26/11 1521  BP: 122/80  Pulse: 70  Temp: 96.5 F (35.8 C)  Resp: 16   Gen'l: Well nourished well developed white male in no acute distress  HEENT: Head: Normocephalic and atraumatic. Right Ear: External ear normal. EAC/TM nl. Left Ear: External ear normal.  EAC/TM nl. Nose: Nose normal. Mouth/Throat: Oropharynx is clear and moist. Dentition - native, in good repair. No buccal or palatal lesions. Posterior pharynx clear. Eyes: Conjunctivae and sclera clear. EOM intact. Pupils are equal, round, and reactive to light. Right eye exhibits no discharge. Left eye exhibits no discharge. Neck: Normal range of motion. Neck supple. No JVD present. No tracheal deviation present. No thyromegaly present.  Cardiovascular: Normal rate, regular rhythm, no  gallop, no friction rub, no murmur heard.      Quiet precordium. 2+ radial and DP pulses . No carotid bruits Pulmonary/Chest: Effort normal. No respiratory distress or increased WOB, no wheezes, no rales. No chest wall deformity or CVAT. Abdominal: Soft. Bowel sounds are normal in all quadrants. He exhibits no distension, no tenderness, no rebound or guarding, No heptosplenomegaly  Genitourinary:  deferred Musculoskeletal: Normal range of motion. He exhibits no edema and no tenderness.       Small and large joints without redness, synovial thickening or deformity. Full range of motion preserved about all small, median and large joints.  Lymphadenopathy:    He has no cervical or supraclavicular adenopathy.  Neurological: He is alert and oriented to person, place, and time. CN II-XII intact. DTRs 2+ and symmetrical biceps, radial and patellar tendons. Cerebellar function normal with no tremor, rigidity, normal gait and station.  Skin: Skin is warm and dry. No rash noted. No erythema.  Psychiatric: He has a  normal mood and affect. His behavior is normal. Thought content normal.   Lab Results  Component Value Date   WBC 6.3 05/21/2010   HGB 15.0 05/21/2010   HCT 44.7 05/21/2010   PLT 206.0 05/21/2010   GLUCOSE 115* 09/27/2011   CHOL 107 09/27/2011   TRIG 54.0 09/27/2011   HDL 57.30 09/27/2011   LDLCALC 39 09/27/2011        ALT 24 09/27/2011   AST 17 09/27/2011        NA 140 09/27/2011   K 4.7 09/27/2011   CL 105 09/27/2011   CREATININE 1.2 09/27/2011   BUN 20 09/27/2011   CO2 30 09/27/2011   TSH 1.30 05/21/2010   PSA 0.63 05/02/2009  NMR lipomed - pending         Assessment & Plan:

## 2011-09-27 ENCOUNTER — Other Ambulatory Visit (INDEPENDENT_AMBULATORY_CARE_PROVIDER_SITE_OTHER): Payer: BC Managed Care – PPO

## 2011-09-27 DIAGNOSIS — I10 Essential (primary) hypertension: Secondary | ICD-10-CM

## 2011-09-27 DIAGNOSIS — E7800 Pure hypercholesterolemia, unspecified: Secondary | ICD-10-CM

## 2011-09-27 DIAGNOSIS — M6250 Muscle wasting and atrophy, not elsewhere classified, unspecified site: Secondary | ICD-10-CM

## 2011-09-27 LAB — LIPID PANEL
HDL: 57.3 mg/dL (ref >39.00)
LDL Cholesterol: 39 mg/dL (ref 0–99)
Total CHOL/HDL Ratio: 2
VLDL: 10.8 mg/dL (ref 0.0–40.0)

## 2011-09-27 LAB — COMPREHENSIVE METABOLIC PANEL
ALT: 24 U/L (ref 0–53)
AST: 17 U/L (ref 0–37)
Albumin: 3.9 g/dL (ref 3.5–5.2)
Alkaline Phosphatase: 85 U/L (ref 39–117)
Glucose, Bld: 115 mg/dL — ABNORMAL HIGH (ref 70–99)
Potassium: 4.7 mEq/L (ref 3.5–5.1)
Sodium: 140 mEq/L (ref 135–145)
Total Bilirubin: 0.3 mg/dL (ref 0.3–1.2)
Total Protein: 6.9 g/dL (ref 6.0–8.3)

## 2011-09-27 LAB — HEPATIC FUNCTION PANEL
ALT: 24 U/L (ref 0–53)
AST: 17 U/L (ref 0–37)
Albumin: 3.9 g/dL (ref 3.5–5.2)
Alkaline Phosphatase: 85 U/L (ref 39–117)
Bilirubin, Direct: 0.1 mg/dL (ref 0.0–0.3)
Total Bilirubin: 0.3 mg/dL (ref 0.3–1.2)
Total Protein: 6.9 g/dL (ref 6.0–8.3)

## 2011-09-29 ENCOUNTER — Encounter: Payer: Self-pay | Admitting: Internal Medicine

## 2011-09-29 DIAGNOSIS — C679 Malignant neoplasm of bladder, unspecified: Secondary | ICD-10-CM | POA: Insufficient documentation

## 2011-09-29 NOTE — Assessment & Plan Note (Signed)
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 Last available note from [facility] August '11 at which time he was stable. PSA was 0.65. Testosterone level was 255. He has some BPH symptoms and does have nocturia x 2-3. Per Dr. Retta Diones he was to return for follow-up and repeat cystoscopy in Aug '12 - no note available.

## 2011-09-29 NOTE — Assessment & Plan Note (Signed)
He has been taking his medication but has not had lab follow up since dose change due to abnormal NMR Lipomed study. Most recent LDL 39. Lipomed results pending.  Plan - follow-up with Dr. Riley Kill

## 2011-09-29 NOTE — Assessment & Plan Note (Signed)
Interval history is unremarkable - he has been well. Physical exam is normal. Lab results are normal. Last PSA '11 0.65, last Testosterone '11 255. Will pull old chart to verify last colonoscopy. Immunizations - due for Tetanus, pneumonia vaccine and shingles vaccine.   In summary- a nice man who appears to be medically stable. He needs to follow up with Dr. Riley Kill and Dr. Retta Diones.  He will return to Primary Care as needed or in 1 year.

## 2011-09-29 NOTE — Assessment & Plan Note (Signed)
No c/o active back pain - other than nagging discomfort. No radicular complaints.

## 2011-09-29 NOTE — Assessment & Plan Note (Signed)
BP Readings from Last 3 Encounters:  09/26/11 122/80  05/24/10 120/82  05/03/10 140/90   Good control.

## 2011-09-30 LAB — TESTOSTERONE, FREE, TOTAL, SHBG
Testosterone, Free: 79.9 pg/mL (ref 47.0–244.0)
Testosterone-% Free: 1.7 % (ref 1.6–2.9)

## 2011-10-01 ENCOUNTER — Encounter: Payer: Self-pay | Admitting: Internal Medicine

## 2011-10-23 ENCOUNTER — Encounter: Payer: Self-pay | Admitting: Sports Medicine

## 2011-10-23 ENCOUNTER — Ambulatory Visit (INDEPENDENT_AMBULATORY_CARE_PROVIDER_SITE_OTHER): Payer: BC Managed Care – PPO | Admitting: Sports Medicine

## 2011-10-23 VITALS — BP 145/85 | HR 64 | Ht 70.5 in | Wt 200.0 lb

## 2011-10-23 DIAGNOSIS — M542 Cervicalgia: Secondary | ICD-10-CM | POA: Insufficient documentation

## 2011-10-23 MED ORDER — ATORVASTATIN CALCIUM 40 MG PO TABS: 40.0000 mg | ORAL_TABLET | Freq: Every day | ORAL | Status: DC

## 2011-10-23 MED ORDER — CYCLOBENZAPRINE HCL 5 MG PO TABS: ORAL_TABLET | ORAL | Status: DC

## 2011-10-23 NOTE — Assessment & Plan Note (Signed)
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 Cont motrin 600 BID-TID. Add flexeril 5mg  qHS. Cont neck rehab. RTC 6 weeks, MRI for facet injection planning if not sufficiently better.

## 2011-10-23 NOTE — Progress Notes (Signed)
  Subjective:    Patient ID: [name], male    DOB: December 18, 1945, 66 y.o.   MRN: [id number]  HPI  [name], comes in for discussion of his left neck pain that has been present for about 2 months. He remembers an episode of trying to twist his head around to release some pain, but then began having intense pain that he localized to the left lateral to the midline kind of deep. He notes a grinding sensation as he takes his neck through the range of motion, and notes that he is unable to turn his head to the left as far as he can on the right side. I saw him at the urgent care center, and had recommended ibuprofen, and some stretching exercises. He takes 600 mg of ibuprofen twice a day, and does a stretching daily. Overall he is feeling better. He denies any radicular pain and denies any weakness in his upper extremities.  Past medical history: Lumbar degenerative disc disease status post epidural injections. Hyperlipidemia, hypertension, history of bladder cancer. Social history: former smoker, denies use of alcohol or drugs. Medications and allergies reviewed from the medical record and no changes needed.  Review of Systems    No fevers, chills, night sweats, weight loss, chest pain, or shortness of breath.  Social History: Non-smoker. Objective:   Physical Exam General:  Well developed, well nourished, and in no acute distress. Neuro:  Alert and oriented x3, extra-ocular muscles intact. Skin: Warm and dry, no rashes noted. Respiratory:  Not using accessory muscles, speaking in full sentences. Musculoskeletal: Neck: Inspection unremarkable. No palpable stepoffs. Negative Spurling's maneuver. Range of motion limited in extension, as well as by about 20 of rotation to the left. Grip strength and sensation normal in bilateral hands Strength good C4 to T1 distribution No sensory change to C4 to T1 Reflexes normal     Assessment & Plan:

## 2011-11-14 ENCOUNTER — Other Ambulatory Visit: Payer: Self-pay | Admitting: Cardiology

## 2011-11-18 ENCOUNTER — Ambulatory Visit (INDEPENDENT_AMBULATORY_CARE_PROVIDER_SITE_OTHER): Payer: BC Managed Care – PPO | Admitting: Cardiology

## 2011-11-18 ENCOUNTER — Encounter: Payer: Self-pay | Admitting: Cardiology

## 2011-11-18 VITALS — BP 130/84 | HR 61 | Ht 70.5 in | Wt 203.4 lb

## 2011-11-18 DIAGNOSIS — I10 Essential (primary) hypertension: Secondary | ICD-10-CM

## 2011-11-18 DIAGNOSIS — E7800 Pure hypercholesterolemia, unspecified: Secondary | ICD-10-CM

## 2011-11-18 MED ORDER — ATORVASTATIN CALCIUM 40 MG PO TABS: 40.0000 mg | ORAL_TABLET | Freq: Every day | ORAL | Status: DC

## 2011-11-18 NOTE — Progress Notes (Signed)
HPI:  He is in for follow up. In general, he is quite stable and without symptoms.  We have followed him primarily over the years because of a family history of premature CAD.  He is on a full lipitor now, and tolerating it well.    Current Outpatient Prescriptions  Medication Sig Dispense Refill  . aspirin EC 81 MG tablet Take one tablet by mouth two times a day.      Marland Kitchen atorvastatin (LIPITOR) 40 MG tablet Take 1 tablet (40 mg total) by mouth daily.  90 tablet  4  . cyclobenzaprine (FLEXERIL) 5 MG tablet Take 5 mg by mouth as needed.      Marland Kitchen LASIX 20 MG tablet TAKE 1 TABLET ONCE DAILY.  90 each  0  . Multiple Vitamin (MULTIVITAMIN) tablet Take 1 tablet by mouth 2 (two) times daily.       Marland Kitchen DISCONTD: atorvastatin (LIPITOR) 40 MG tablet Take 1 tablet (40 mg total) by mouth daily.  90 tablet  4    No Known Allergies  Past Medical History  Diagnosis Date  . Degeneration of lumbar or lumbosacral intervertebral disc   . Hypertension     mild  . Pure hypercholesterolemia   . Unspecified hearing loss     Past Surgical History  Procedure Date  . Tonsillectomy   . Transurethral resection of bladder tumor 1999    benign polyp    Family History  Problem Relation Age of Onset  . Heart attack Mother     age 26  . Heart disease Mother     CHF  . Hyperlipidemia Mother   . Heart attack Father 45    died of cardiovascular diease  . Heart disease Father     CAD/MI  . Heart attack Brother 21    S/P CABG @ 28  . Heart disease Brother     CAD/MI@21 , CABG @ 28  . Colon cancer Neg Hx   . Lung cancer Neg Hx   . Prostate cancer Neg Hx   . Diabetes Neg Hx   . Cancer Neg Hx   . Hyperlipidemia Other   . Hypertension Other     History   Social History  . Marital Status: Married    Spouse Name: N/A    Number of Children: 2  . Years of Education: N/A   Occupational History  . [employer]   Social History Main Topics  . Smoking status:  Former Smoker -- 15 years    Types: Cigarettes  . Smokeless tobacco: Never Used  . Alcohol Use: 0.0 oz/week     Two ounces of alcohol per month  . Drug Use: No  . Sexually Active: Yes -- Male partner(s)   Other Topics Concern  . Not on file   Social History Narrative   HSG, [place]-undergrad. Married- 1987. 2 sons-'91 [employer], '94-headed for [employer]. 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    ROS: Please see the HPI.  All other systems reviewed and negative.  PHYSICAL EXAM:  BP 130/84  Pulse 61  Ht 5' 10.5" (1.791 m)  Wt 203 lb 6.4 oz (92.262 kg)  BMI 28.77 kg/m2  General: Well developed, well nourished, in no acute distress. Head:  Normocephalic and atraumatic. Neck: no JVD Lungs: Clear to auscultation and percussion. Heart: Normal S1 and S2.  No murmur, rubs or gallops.  Abdomen:  Normal bowel sounds; soft; non tender; no organomegaly Pulses: Pulses normal  in all 4 extremities. Extremities: No clubbing or cyanosis. No edema. Neurologic: Alert and oriented x 3.  EKG:  NSR.    ASSESSMENT AND PLAN:

## 2011-11-18 NOTE — Progress Notes (Signed)
   HPI:  Patient is in for follow up.  He is doing well.  No major complaints today.   Current Outpatient Prescriptions  Medication Sig Dispense Refill  . aspirin EC 81 MG tablet Take one tablet by mouth two times a day.      Marland Kitchen atorvastatin (LIPITOR) 40 MG tablet Take 1 tablet (40 mg total) by mouth daily.  90 tablet  4  . cyclobenzaprine (FLEXERIL) 5 MG tablet Take 5 mg by mouth as needed.      Marland Kitchen LASIX 20 MG tablet TAKE 1 TABLET ONCE DAILY.  90 each  0  . Multiple Vitamin (MULTIVITAMIN) tablet Take 1 tablet by mouth 2 (two) times daily.         No Known Allergies  Past Medical History  Diagnosis Date  . Degeneration of lumbar or lumbosacral intervertebral disc   . Hypertension     mild  . Pure hypercholesterolemia   . Unspecified hearing loss     Past Surgical History  Procedure Date  . Tonsillectomy   . Transurethral resection of bladder tumor 1999    benign polyp    Family History  Problem Relation Age of Onset  . Heart attack Mother     age 16  . Heart disease Mother     CHF  . Hyperlipidemia Mother   . Heart attack Father 45    died of cardiovascular diease  . Heart disease Father     CAD/MI  . Heart attack Brother 21    S/P CABG @ 28  . Heart disease Brother     CAD/MI@21 , CABG @ 28  . Colon cancer Neg Hx   . Lung cancer Neg Hx   . Prostate cancer Neg Hx   . Diabetes Neg Hx   . Cancer Neg Hx   . Hyperlipidemia Other   . Hypertension Other     History   Social History  . Marital Status: Married    Spouse Name: N/A    Number of Children: 2  . Years of Education: N/A   Occupational History  . [employer]   Social History Main Topics  . Smoking status: Former Smoker -- 15 years    Types: Cigarettes  . Smokeless tobacco: Never Used  . Alcohol Use: 0.0 oz/week     Two ounces of alcohol per month  . Drug Use: No  . Sexually Active: Yes -- Male partner(s)   Other Topics Concern  . Not on file   Social  History Narrative   HSG, [place]-undergrad. Married- 1987. 2 sons-'91 [employer], '94-headed for [employer]. 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    ROS: Please see the HPI.  All other systems reviewed and negative.  PHYSICAL EXAM:  BP 130/84  Pulse 61  Ht 5' 10.5" (1.791 m)  Wt 203 lb 6.4 oz (92.262 kg)  BMI 28.77 kg/m2  General: Well developed, well nourished, in no acute distress. Head:  Normocephalic and atraumatic. Neck: no JVD Lungs: Clear to auscultation and percussion. Heart: Normal S1 and S2.  No murmur, rubs or gallops.  Abdomen:  Normal bowel sounds; soft; non tender; no organomegaly Pulses: Pulses normal in all 4 extremities. Extremities: No clubbing or cyanosis. No edema. Neurologic: Alert and oriented x 3.  EKG:  NSR.  LAFB.  No acute changes ASSESSMENT AND PLAN:

## 2011-11-18 NOTE — Patient Instructions (Signed)
Your physician recommends that you schedule a follow-up appointment as needed.   Your physician recommends that you continue on your current medications as directed. Please refer to the Current Medication list given to you today.  

## 2011-11-18 NOTE — Assessment & Plan Note (Addendum)
Borderline.  140/90 in both arms by me.  He is to check some at home.  If they remain elevated, I have asked him to follow up with Dr. Debby Bud so that adjustments can be made.

## 2011-11-18 NOTE — Assessment & Plan Note (Signed)
Most recent numbers reviewed with patient. He is at [facility].

## 2011-11-21 ENCOUNTER — Other Ambulatory Visit: Payer: Self-pay | Admitting: *Deleted

## 2011-11-21 MED ORDER — FUROSEMIDE 20 MG PO TABS: 20.0000 mg | ORAL_TABLET | Freq: Every day | ORAL | Status: DC

## 2011-11-21 NOTE — Telephone Encounter (Signed)
Refill on med for lasix sent to pharmacy

## 2011-11-22 ENCOUNTER — Other Ambulatory Visit: Payer: Self-pay | Admitting: Internal Medicine

## 2011-11-25 NOTE — Telephone Encounter (Signed)
Furosemide refill just sent to pharmacy on 11/21/11. Denial sent to pharmacy to check refills on file.

## 2011-11-27 ENCOUNTER — Ambulatory Visit: Payer: BC Managed Care – PPO | Admitting: Sports Medicine

## 2011-12-12 ENCOUNTER — Encounter: Payer: Self-pay | Admitting: Gastroenterology

## 2012-01-08 ENCOUNTER — Encounter: Payer: Self-pay | Admitting: Gastroenterology

## 2012-01-08 ENCOUNTER — Ambulatory Visit (INDEPENDENT_AMBULATORY_CARE_PROVIDER_SITE_OTHER): Payer: BC Managed Care – PPO | Admitting: Gastroenterology

## 2012-01-08 VITALS — BP 136/80 | HR 60 | Ht 70.0 in | Wt 202.0 lb

## 2012-01-08 DIAGNOSIS — R194 Change in bowel habit: Secondary | ICD-10-CM

## 2012-01-08 DIAGNOSIS — K648 Other hemorrhoids: Secondary | ICD-10-CM | POA: Insufficient documentation

## 2012-01-08 DIAGNOSIS — R198 Other specified symptoms and signs involving the digestive system and abdomen: Secondary | ICD-10-CM

## 2012-01-08 MED ORDER — HYDROCORTISONE ACETATE 25 MG RE SUPP: 25.0000 mg | Freq: Two times a day (BID) | RECTAL | Status: AC

## 2012-01-08 NOTE — Patient Instructions (Addendum)
Colonoscopy A colonoscopy is an exam to evaluate your entire colon. In this exam, your colon is cleansed. A long fiberoptic tube is inserted through your rectum and into your colon. The fiberoptic scope (endoscope) is a long bundle of enclosed and very flexible fibers. These fibers transmit light to the area examined and send images from that area to your caregiver. Discomfort is usually minimal. You may be given a drug to help you sleep (sedative) during or prior to the procedure. This exam helps to detect lumps (tumors), polyps, inflammation, and areas of bleeding. Your caregiver may also take a small piece of tissue (biopsy) that will be examined under a microscope. LET YOUR CAREGIVER KNOW ABOUT:   Allergies to food or medicine.   Medicines taken, including vitamins, herbs, eyedrops, over-the-counter medicines, and creams.   Use of steroids (by mouth or creams).   Previous problems with anesthetics or numbing medicines.   History of bleeding problems or blood clots.   Previous surgery.   Other health problems, including diabetes and kidney problems.   Possibility of pregnancy, if this applies.  BEFORE THE PROCEDURE   A clear liquid diet may be required for 2 days before the exam.   Ask your caregiver about changing or stopping your regular medications.   Liquid injections (enemas) or laxatives may be required.   A large amount of electrolyte solution may be given to you to drink over a short period of time. This solution is used to clean out your colon.   You should be present 60 minutes prior to your procedure or as directed by your caregiver.  AFTER THE PROCEDURE   If you received a sedative or pain relieving medication, you will need to arrange for someone to drive you home.   Occasionally, there is a little blood passed with the first bowel movement. Do not be concerned.  FINDING OUT THE RESULTS OF YOUR TEST Not all test results are available during your visit. If your test  results are not back during the visit, make an appointment with your caregiver to find out the results. Do not assume everything is normal if you have not heard from your caregiver or the medical facility. It is important for you to follow up on all of your test results. HOME CARE INSTRUCTIONS   It is not unusual to pass moderate amounts of gas and experience mild abdominal cramping following the procedure. This is due to air being used to inflate your colon during the exam. Walking or a warm pack on your belly (abdomen) may help.   You may resume all normal meals and activities after sedatives and medicines have worn off.   Only take over-the-counter or prescription medicines for pain, discomfort, or fever as directed by your caregiver. Do not use aspirin or blood thinners if a biopsy was taken. Consult your caregiver for medicine usage if biopsies were taken.  SEEK IMMEDIATE MEDICAL CARE IF:   You have a fever.   You pass large blood clots or fill a toilet with blood following the procedure. This may also occur 10 to 14 days following the procedure. This is more likely if a biopsy was taken.   You develop abdominal pain that keeps getting worse and cannot be relieved with medicine.  Document Released: 06/28/2000 Document Revised: 06/20/2011 Document Reviewed: 02/11/2008 [facility] Patient Information 2012 [place], [place].  It has been recommended by Dr Arlyce Dice that you schedule a colonoscopy You will need to call back to schedule when you decide  on a date. You will be scheduled with a PreVisit nurse at that time to sign your paperwork and get instructions

## 2012-01-08 NOTE — Assessment & Plan Note (Addendum)
He has experienced a change in bowel habits. A structural abnormality of the colon should be ruled out.  Recommendations #1 colonoscopy

## 2012-01-08 NOTE — Assessment & Plan Note (Addendum)
He has external hemorrhoids were grade 4 internal hemorrhoids.  Recommendations #1 Anusol HC suppositories

## 2012-01-08 NOTE — Progress Notes (Signed)
History of Present Illness:  [name] is a pleasant 66 year old white male referred at the request of Dr. Ranell Martinez for evaluation of hemorrhoids. In the last year he's had a change in bowel habits. Heretofore he suffered from mild constipation. Now he has frequent loose stools consisting of poorly formed stools several times a week. Should he have multiple stools on a given day he may develop blood mixed with the toilet water and some mild rectal discomfort. There is no history of passing blood clots. There's been no change in his diet or medications.  Colonoscopy in 2007 was normal.    Past Medical History  Diagnosis Date  . Degeneration of lumbar or lumbosacral intervertebral disc   . Hypertension     mild  . Pure hypercholesterolemia   . Unspecified hearing loss   . Hemorrhoids    Past Surgical History  Procedure Date  . Tonsillectomy   . Transurethral resection of bladder tumor 1999    benign polyp   family history includes Heart attack in his mother; Heart attack (age of onset:21) in his brother; Heart attack (age of onset:45) in his father; Heart disease in his brother, father, and mother; Hyperlipidemia in his mother and other; and Hypertension in his other.  There is no history of Colon cancer, and Lung cancer, and Prostate cancer, and Diabetes, and Cancer, . Current Outpatient Prescriptions  Medication Sig Dispense Refill  . aspirin EC 81 MG tablet Take one tablet by mouth two times a day.      Marland Kitchen atorvastatin (LIPITOR) 40 MG tablet Take 1 tablet (40 mg total) by mouth daily.  90 tablet  4  . furosemide (LASIX) 20 MG tablet Take 1 tablet (20 mg total) by mouth daily.  90 tablet  1  . Multiple Vitamin (MULTIVITAMIN) tablet Take 1 tablet by mouth 2 (two) times daily.        Allergies as of 01/08/2012  . (No Known Allergies)    reports that he has quit smoking. His smoking use included Cigarettes. He quit after 15 years of use. He has never used smokeless tobacco. He reports that he  does not drink alcohol or use illicit drugs.     Review of Systems: Pertinent positive and negative review of systems were noted in the above HPI section. All other review of systems were otherwise negative.  Vital signs were reviewed in today's medical record Physical Exam: General: Well developed , well nourished, no acute distress Head: Normocephalic and atraumatic Eyes:  sclerae anicteric, EOMI Ears: Normal auditory acuity Mouth: No deformity or lesions Neck: Supple, no masses or thyromegaly Lungs: Clear throughout to auscultation Heart: Regular rate and rhythm; no murmurs, rubs or bruits Abdomen: Soft, non tender and non distended. No masses, hepatosplenomegaly or hernias noted. Normal Bowel sounds Rectal: There are external hemorrhoids with skin tags Musculoskeletal: Symmetrical with no gross deformities  Skin: No lesions on visible extremities Pulses:  Normal pulses noted Extremities: No clubbing, cyanosis, edema or deformities noted Neurological: Alert oriented x 4, grossly nonfocal Cervical Nodes:  No significant cervical adenopathy Inguinal Nodes: No significant inguinal adenopathy Psychological:  Alert and cooperative. Normal mood and affect

## 2012-01-13 ENCOUNTER — Ambulatory Visit (AMBULATORY_SURGERY_CENTER): Payer: BC Managed Care – PPO | Admitting: *Deleted

## 2012-01-13 ENCOUNTER — Encounter: Payer: Self-pay | Admitting: Gastroenterology

## 2012-01-13 VITALS — Ht 70.0 in | Wt 206.2 lb

## 2012-01-13 DIAGNOSIS — R198 Other specified symptoms and signs involving the digestive system and abdomen: Secondary | ICD-10-CM

## 2012-01-13 DIAGNOSIS — K649 Unspecified hemorrhoids: Secondary | ICD-10-CM

## 2012-01-13 MED ORDER — MOVIPREP 100 G PO SOLR: ORAL | Status: DC

## 2012-01-14 ENCOUNTER — Ambulatory Visit (INDEPENDENT_AMBULATORY_CARE_PROVIDER_SITE_OTHER): Payer: BC Managed Care – PPO

## 2012-01-16 ENCOUNTER — Ambulatory Visit (INDEPENDENT_AMBULATORY_CARE_PROVIDER_SITE_OTHER): Payer: BC Managed Care – PPO | Admitting: Emergency Medicine

## 2012-01-16 VITALS — BP 132/88 | HR 72 | Temp 98.0°F | Resp 16 | Ht 70.0 in | Wt 203.2 lb

## 2012-01-16 DIAGNOSIS — H1030 Unspecified acute conjunctivitis, unspecified eye: Secondary | ICD-10-CM

## 2012-01-16 MED ORDER — SULFACETAMIDE SODIUM 10 % OP SOLN: 2.0000 [drp] | OPHTHALMIC | Status: AC

## 2012-01-16 MED ORDER — SULFACETAMIDE SODIUM 10 % OP SOLN: 2.0000 [drp] | OPHTHALMIC | Status: DC

## 2012-01-16 NOTE — Addendum Note (Signed)
Addended by: Jacqualyn Posey on: 01/16/2012 12:02 PM   Modules accepted: Orders

## 2012-01-16 NOTE — Progress Notes (Signed)
Patient Name: [name] Date of Birth: [birth year] Medical Record Number: [id number] Gender: male Date of Encounter: 01/16/2012  Chief Complaint: Conjunctivitis   History of Present Illness:  [name] is a 66 y.o. very pleasant male patient who presents with the following:  Bilateral conjunctivitis no visual symptoms.  No pain.  Just crusted drainage.  No fb seneation  Patient Active Problem List  Diagnosis  . HYPERCHOLESTEROLEMIA  . HEARING LOSS  . HYPERTENSION, MILD  . DISC DISEASE, LUMBAR  . Routine health maintenance  . Urinary bladder cancer  . Neck pain  . Internal hemorrhoids without mention of complication  . Change in bowel habits   Past Medical History  Diagnosis Date  . Degeneration of lumbar or lumbosacral intervertebral disc   . Hypertension     mild  . Pure hypercholesterolemia   . Unspecified hearing loss   . Hemorrhoids    Past Surgical History  Procedure Date  . Tonsillectomy   . Transurethral resection of bladder tumor 1999    benign polyp   History  Substance Use Topics  . Smoking status: Former Smoker -- 15 years    Types: Cigarettes  . Smokeless tobacco: Never Used  . Alcohol Use: No   Family History  Problem Relation Age of Onset  . Heart attack Mother     age 32  . Heart disease Mother     CHF  . Hyperlipidemia Mother   . Heart attack Father 45    died of cardiovascular diease  . Heart disease Father     CAD/MI  . Heart attack Brother 21    S/P CABG @ 28  . Heart disease Brother     CAD/MI@21 , CABG @ 28  . Lung cancer Neg Hx   . Prostate cancer Neg Hx   . Diabetes Neg Hx   . Cancer Neg Hx   . Hyperlipidemia Other   . Hypertension Other   . Colon cancer Cousin 72   No Known Allergies  Medication list has been reviewed and updated.  Current Outpatient Prescriptions on File Prior to Visit  Medication Sig Dispense Refill  . aspirin EC 81 MG tablet Take one tablet by mouth two times a day.      Marland Kitchen atorvastatin  (LIPITOR) 40 MG tablet Take 1 tablet (40 mg total) by mouth daily.  90 tablet  4  . furosemide (LASIX) 20 MG tablet Take 1 tablet (20 mg total) by mouth daily.  90 tablet  1  . Multiple Vitamin (MULTIVITAMIN) tablet Take 1 tablet by mouth 2 (two) times daily.       . hydrocortisone (ANUSOL-HC) 25 MG suppository Place 1 suppository (25 mg total) rectally every 12 (twelve) hours.  12 suppository  1  . MOVIPREP 100 G SOLR moviprep as directed// no substitutions  1 kit  0    Review of Systems:  As per HPI, otherwise negative.    Physical Examination: Filed Vitals:   01/16/12 1136  BP: 132/88  Pulse: 72  Temp: 98 F (36.7 C)  Resp: 16   Filed Vitals:   01/16/12 1136  Height: 5\' 10"  (1.778 m)  Weight: 203 lb 3.2 oz (92.171 kg)   Body mass index is 29.16 kg/(m^2). Ideal Body Weight: Weight in (lb) to have BMI = 25: 173.9    GEN: WDWN, NAD, Non-toxic, Alert & Oriented x 3 HEENT: Atraumatic, Normocephalic.  Ears and Nose: No external deformity. EXTR: No clubbing/cyanosis/edema NEURO: Normal gait.  PSYCH: Normally  interactive. Conversant. Not depressed or anxious appearing.  Calm demeanor.  Eyes:  Bilateral conjunctivitis  EKG / Labs / Xrays: None available at time of encounter  Assessment and Plan: Conjunctivitis Follow up as needed  Carmelina Dane, MD

## 2012-01-23 ENCOUNTER — Ambulatory Visit (AMBULATORY_SURGERY_CENTER): Payer: BC Managed Care – PPO | Admitting: Gastroenterology

## 2012-01-23 ENCOUNTER — Encounter: Payer: Self-pay | Admitting: Gastroenterology

## 2012-01-23 VITALS — BP 130/82 | HR 67 | Temp 96.4°F | Resp 20 | Ht 70.0 in | Wt 206.0 lb

## 2012-01-23 DIAGNOSIS — R198 Other specified symptoms and signs involving the digestive system and abdomen: Secondary | ICD-10-CM

## 2012-01-23 DIAGNOSIS — K649 Unspecified hemorrhoids: Secondary | ICD-10-CM

## 2012-01-23 DIAGNOSIS — K6289 Other specified diseases of anus and rectum: Secondary | ICD-10-CM

## 2012-01-23 MED ORDER — SODIUM CHLORIDE 0.9 % IV SOLN: 500.0000 mL | INTRAVENOUS | Status: DC

## 2012-01-23 MED ORDER — PRAMOXINE HCL 1 % RE FOAM: RECTAL | Status: DC

## 2012-01-23 NOTE — Op Note (Addendum)
[facility] [address] [place], [place]  [address]  COLONOSCOPY PROCEDURE REPORT  PATIENT:  [name], [name]  MR#:  [id number] BIRTHDATE:  10-27-1945, 66 yrs. old  GENDER:  male ENDOSCOPIST:  Barbette Hair. Arlyce Dice, MD REF. BY: PROCEDURE DATE:  01/23/2012 PROCEDURE:  Colonoscopy with biopsy ASA CLASS:  Class II INDICATIONS:  change in bowel habits MEDICATIONS:   MAC sedation, administered by CRNA propofol 250mg IV  DESCRIPTION OF PROCEDURE:   After the risks benefits and alternatives of the procedure were thoroughly explained, informed consent was obtained.  Digital rectal exam was performed and revealed no abnormalities.   The LB CF-[id number] endoscope was introduced through the anus and advanced to the cecum, which was identified by both the appendix and ileocecal valve, without limitations.  The quality of the prep was excellent, using MiraLax.  The instrument was then slowly withdrawn as the colon was fully examined. 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 Mildly injected mucosa with areas of submucosal hemorrhage confined to rectal vault. Bxs taken (see image4, image5, and image3).  This was otherwise a normal examination of the colon (see image1). Retroflexed views in the rectum revealed no other findings other than those already described.    The time to cecum =  1) 3.50 minutes. The scope was then withdrawn in  1) 8.50  minutes from the cecum and the procedure completed. 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 Arlyce Dice, MD  CC:  Jacques Navy, MD  n. REVISED:  01/23/2012 09:22 AM eSIGNED:   Barbette Hair. Kaplan at 01/23/2012 09:22 AM  Clarene Essex, [id number]

## 2012-01-23 NOTE — Progress Notes (Signed)
Patient did not experience any of the following events: a burn prior to discharge; a fall within the facility; wrong site/side/patient/procedure/implant event; or a hospital transfer or hospital admission upon discharge from the facility. (G8907) Patient did not have preoperative order for IV antibiotic SSI prophylaxis. (G8918)  

## 2012-01-23 NOTE — Progress Notes (Signed)
Propofol given and oxygen managed per S Camp CRNA 

## 2012-01-23 NOTE — Patient Instructions (Addendum)
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  Begin using proctofoam.  Wait for biopsy results.  Nurse will call you with appointment to see Dr Arlyce Dice in office in 2-3 weeks.   YOU HAD AN ENDOSCOPIC PROCEDURE TODAY AT THE [facility]: Refer to the procedure report that was given to you for any specific questions about what was found during the examination.  If the procedure report does not answer your questions, please call your gastroenterologist to clarify.  If you requested that your care partner not be given the details of your procedure findings, then the procedure report has been included in a sealed envelope for you to review at your convenience later.  YOU SHOULD EXPECT: Some feelings of bloating in the abdomen. Passage of more gas than usual.  Walking can help get rid of the air that was put into your GI tract during the procedure and reduce the bloating. If you had a lower endoscopy (such as a colonoscopy or flexible sigmoidoscopy) you may notice spotting of blood in your stool or on the toilet paper. If you underwent a bowel prep for your procedure, then you may not have a normal bowel movement for a few days.  DIET: Your first meal following the procedure should be a light meal and then it is ok to progress to your normal diet.  A half-sandwich or bowl of soup is an example of a good first meal.  Heavy or fried foods are harder to digest and may make you feel nauseous or bloated.  Likewise meals heavy in dairy and vegetables can cause extra gas to form and this can also increase the bloating.  Drink plenty of fluids but you should avoid alcoholic beverages for 24 hours.  ACTIVITY: Your care partner should take you home directly after the procedure.  You should plan to take it easy, moving slowly for the rest of the day.  You can resume normal activity the day after the procedure however you should NOT DRIVE or use heavy machinery for 24 hours (because of the sedation medicines used during the test).     SYMPTOMS TO REPORT IMMEDIATELY: A gastroenterologist can be reached at any hour.  During normal business hours, 8:30 AM to 5:00 PM Monday through Friday, call [phone].  After hours and on weekends, please call the GI answering service at [phone] who will take a message and have the physician on call contact you.   Following lower endoscopy (colonoscopy or flexible sigmoidoscopy):  Excessive amounts of blood in the stool  Significant tenderness or worsening of abdominal pains  Swelling of the abdomen that is new, acute  Fever of 100F or higher  Following upper endoscopy (EGD)  Vomiting of blood or coffee ground material  New chest pain or pain under the shoulder blades  Painful or persistently difficult swallowing  New shortness of breath  Fever of 100F or higher  Black, tarry-looking stools  FOLLOW UP: If any biopsies were taken you will be contacted by phone or by letter within the next 1-3 weeks.  Call your gastroenterologist if you have not heard about the biopsies in 3 weeks.  Our staff will call the home number listed on your records the next business day following your procedure to check on you and address any questions or concerns that you may have at that time regarding the information given to you following your procedure. This is a courtesy call and so if there is no answer at the home number and we  have not heard from you through the emergency physician on call, we will assume that you have returned to your regular daily activities without incident.  SIGNATURES/CONFIDENTIALITY: You and/or your care partner have signed paperwork which will be entered into your electronic medical record.  These signatures attest to the fact that that the information above on your After Visit Summary has been reviewed and is understood.  Full responsibility of the confidentiality of this discharge information lies with you and/or your care-partner.   Please follow all discharge  instructions given to you by the recovery room nurse. If you have any questions or problems after discharge please call one of the numbers listed above. You will receive a phone call in the am to see how you are doing and answer any questions you may have. Thank you for choosing [facility] for your health care needs.

## 2012-01-24 ENCOUNTER — Telehealth: Payer: Self-pay

## 2012-01-24 NOTE — Telephone Encounter (Signed)
  Follow up Call-  Call back number 01/23/2012  Post procedure Call Back phone  # [phone]  Permission to leave phone message Yes     Patient questions:  Do you have a fever, pain , or abdominal swelling? no Pain Score  0 *  Have you tolerated food without any problems? yes  Have you been able to return to your normal activities? yes  Do you have any questions about your discharge instructions: Diet   no Medications  no Follow up visit  no  Do you have questions or concerns about your Care? no  Actions: * If pain score is 4 or above: No action needed, pain <4.

## 2012-02-03 ENCOUNTER — Encounter: Payer: Self-pay | Admitting: Gastroenterology

## 2012-02-12 ENCOUNTER — Encounter: Payer: Self-pay | Admitting: Gastroenterology

## 2012-02-12 ENCOUNTER — Ambulatory Visit (INDEPENDENT_AMBULATORY_CARE_PROVIDER_SITE_OTHER): Payer: BC Managed Care – PPO | Admitting: Gastroenterology

## 2012-02-12 VITALS — BP 122/64 | HR 72 | Ht 71.0 in | Wt 204.4 lb

## 2012-02-12 DIAGNOSIS — K648 Other hemorrhoids: Secondary | ICD-10-CM

## 2012-02-12 DIAGNOSIS — R194 Change in bowel habit: Secondary | ICD-10-CM

## 2012-02-12 DIAGNOSIS — K6289 Other specified diseases of anus and rectum: Secondary | ICD-10-CM

## 2012-02-12 DIAGNOSIS — R198 Other specified symptoms and signs involving the digestive system and abdomen: Secondary | ICD-10-CM

## 2012-02-12 NOTE — Assessment & Plan Note (Addendum)
Changes of her mild and may the not be contributing to his bleeding.  Recommendations #1 ProctoFoam or Anusol HC suppository as needed

## 2012-02-12 NOTE — Patient Instructions (Addendum)
Please follow up with Dr Arlyce Dice as needed. CC: Dr Arthur Holms

## 2012-02-12 NOTE — Progress Notes (Signed)
History of Present Illness:  [name] returned following colonoscopy where mild proctitis was seen. He was placed on ProctoFoam with resolution of his bleeding. He was drinking 14-15 bottles of green tea a day. When he stopped this his diarrhea entirely subsided.    Review of Systems: Pertinent positive and negative review of systems were noted in the above HPI section. All other review of systems were otherwise negative.    Current Medications, Allergies, Past Medical History, Past Surgical History, Family History and Social History were reviewed in [employer] electronic medical record  Vital signs were reviewed in today's medical record. Physical Exam: General: Well developed , well nourished, no acute distress

## 2012-02-12 NOTE — Assessment & Plan Note (Signed)
Plan ProctoFoam as needed or Anusol HC suppository

## 2012-02-12 NOTE — Assessment & Plan Note (Signed)
Symptoms clearly correlate with his ingestion of large amounts of green tea. He will temper the amount that he drinks.

## 2012-02-18 ENCOUNTER — Other Ambulatory Visit: Payer: Self-pay | Admitting: Emergency Medicine

## 2012-02-18 NOTE — Telephone Encounter (Signed)
?  Deny, needs OV if needing this rx

## 2012-05-18 ENCOUNTER — Other Ambulatory Visit: Payer: Self-pay | Admitting: Internal Medicine

## 2012-08-22 ENCOUNTER — Other Ambulatory Visit: Payer: Self-pay | Admitting: Internal Medicine

## 2012-08-24 ENCOUNTER — Other Ambulatory Visit: Payer: Self-pay | Admitting: *Deleted

## 2012-08-24 MED ORDER — FUROSEMIDE 20 MG PO TABS: 20.0000 mg | ORAL_TABLET | Freq: Every day | ORAL | Status: DC

## 2012-09-28 ENCOUNTER — Other Ambulatory Visit (INDEPENDENT_AMBULATORY_CARE_PROVIDER_SITE_OTHER): Payer: BC Managed Care – PPO

## 2012-09-28 ENCOUNTER — Ambulatory Visit (INDEPENDENT_AMBULATORY_CARE_PROVIDER_SITE_OTHER): Payer: BC Managed Care – PPO | Admitting: Internal Medicine

## 2012-09-28 ENCOUNTER — Encounter: Payer: Self-pay | Admitting: Internal Medicine

## 2012-09-28 ENCOUNTER — Telehealth: Payer: Self-pay

## 2012-09-28 VITALS — BP 138/90 | HR 71 | Temp 97.7°F | Resp 12 | Wt 209.0 lb

## 2012-09-28 DIAGNOSIS — C679 Malignant neoplasm of bladder, unspecified: Secondary | ICD-10-CM

## 2012-09-28 DIAGNOSIS — E7800 Pure hypercholesterolemia, unspecified: Secondary | ICD-10-CM

## 2012-09-28 DIAGNOSIS — Z23 Encounter for immunization: Secondary | ICD-10-CM

## 2012-09-28 DIAGNOSIS — I10 Essential (primary) hypertension: Secondary | ICD-10-CM

## 2012-09-28 DIAGNOSIS — Z8546 Personal history of malignant neoplasm of prostate: Secondary | ICD-10-CM

## 2012-09-28 DIAGNOSIS — Z0000 Encounter for general adult medical examination without abnormal findings: Secondary | ICD-10-CM

## 2012-09-28 LAB — COMPREHENSIVE METABOLIC PANEL
Albumin: 4.4 g/dL (ref 3.5–5.2)
CO2: 26 mEq/L (ref 19–32)
GFR: 72.01 mL/min (ref >60.00)
Glucose, Bld: 104 mg/dL — ABNORMAL HIGH (ref 70–99)
Sodium: 137 mEq/L (ref 135–145)
Total Bilirubin: 0.7 mg/dL (ref 0.3–1.2)
Total Protein: 7.2 g/dL (ref 6.0–8.3)

## 2012-09-28 LAB — LIPID PANEL
Cholesterol: 124 mg/dL (ref 0–200)
HDL: 47.7 mg/dL (ref >39.00)

## 2012-09-28 LAB — PSA: PSA: 0.61 ng/mL (ref 0.10–4.00)

## 2012-09-28 LAB — HEPATIC FUNCTION PANEL
AST: 25 U/L (ref 0–37)
Total Bilirubin: 0.7 mg/dL (ref 0.3–1.2)

## 2012-09-28 MED ORDER — TETANUS-DIPHTH-ACELL PERTUSSIS 5-2.5-18.5 LF-MCG/0.5 IM SUSP
0.5000 mL | Freq: Once | INTRAMUSCULAR | Status: AC
  Administered 2012-09-28: 0.5 mL via INTRAMUSCULAR

## 2012-09-28 MED ORDER — PNEUMOCOCCAL VAC POLYVALENT 25 MCG/0.5ML IJ INJ: 0.5000 mL | INJECTION | INTRAMUSCULAR | Status: DC

## 2012-09-28 NOTE — Progress Notes (Signed)
Subjective:    Patient ID: [name], male    DOB: 08-Feb-1946, 67 y.o.   MRN: [id number]  HPI The patient is here for annual Medicare wellness examination and management of other chronic and acute problems.  He is doing well - no major health problems in the interval, no surgery, no injury.   The risk factors are reflected in the social history.  The roster of all physicians providing medical care to patient - is listed in the Snapshot section of the chart.  Activities of daily living:  The patient is 100% inedpendent in all ADLs: dressing, toileting, feeding as well as independent mobility  Home safety : The patient has smoke detectors in the home. Falls - no falls, home is fall safe. They wear seatbelts. No firearms at home  There is no risks for hepatitis, STDs or HIV. There is no history of blood transfusion. They have no travel history to infectious disease endemic areas of the world.  The patient has seen their dentist in the last six month. They have seen their eye doctor in the last year. They admit to any hearing difficulty and have not had audiologic testing in the last year.    They do not  have excessive sun exposure. Discussed the need for sun protection: hats, long sleeves and use of sunscreen if there is significant sun exposure.   Diet: the importance of a healthy diet is discussed. They do have a healthy diet.  The patient has no regular exercise program.  The benefits of regular aerobic exercise were discussed.  Depression screen: there are no signs or vegative symptoms of depression- irritability, change in appetite, anhedonia, sadness/tearfullness.  Cognitive assessment: the patient manages all their financial and personal affairs and is actively engaged.   The following portions of the patient's history were reviewed and updated as appropriate: allergies, current medications, past family history, past medical history,  past surgical history, past social history   and problem list.  Vision, hearing, body mass index were assessed and reviewed.   During the course of the visit the patient was educated and counseled about appropriate screening and preventive services including : fall prevention , diabetes screening, nutrition counseling, colorectal cancer screening, and recommended immunizations.    Review of Systems Constitutional:  Negative for fever, chills, activity change and unexpected weight change.  HEENT:  Negative for hearing loss, ear pain, congestion, neck stiffness and postnasal drip. Negative for sore throat or swallowing problems. Negative for dental complaints.   Eyes: Negative for vision loss or change in visual acuity.  Respiratory: Negative for chest tightness and wheezing. Negative for DOE.   Cardiovascular: Negative for chest pain or palpitations. No decreased exercise tolerance Gastrointestinal: No change in bowel habit. No bloating or gas. No reflux or indigestion Genitourinary: Negative for urgency, frequency, flank pain and difficulty urinating.  Musculoskeletal: Negative for myalgias, back pain, arthralgias and gait problem.  Neurological: Negative for dizziness, tremors, weakness and headaches.  Hematological: Negative for adenopathy.  Psychiatric/Behavioral: Negative for behavioral problems and dysphoric mood.       Objective:   Physical Exam Filed Vitals:   09/28/12 1036  BP: 138/90  Pulse: 71  Temp: 97.7 F (36.5 C)  Resp: 12   Wt Readings from Last 3 Encounters:  09/28/12 209 lb (94.802 kg)  02/12/12 204 lb 6.4 oz (92.715 kg)  01/23/12 206 lb (93.441 kg)   Gen'l: Well nourished well developed white male in no acute distress  HEENT: Head: Normocephalic  and atraumatic. Right Ear: External ear normal. EAC/TM nl. Left Ear: External ear normal.  EAC/TM nl. Nose: Nose normal. Mouth/Throat: Oropharynx is clear and moist. Dentition - native, in good repair. No buccal or palatal lesions. Posterior pharynx clear. Eyes:  Conjunctivae and sclera clear. EOM intact. Pupils are equal, round, and reactive to light. Right eye exhibits no discharge. Left eye exhibits no discharge. Neck: Normal range of motion. Neck supple. No JVD present. No tracheal deviation present. No thyromegaly present.  Cardiovascular: Normal rate, regular rhythm, no gallop, no friction rub, no murmur heard.      Quiet precordium. 2+ radial and DP pulses . No carotid bruits Pulmonary/Chest: Effort normal. No respiratory distress or increased WOB, no wheezes, no rales. No chest wall deformity or CVAT. Abdomen: Soft. Bowel sounds are normal in all quadrants. He exhibits no distension, no tenderness, no rebound or guarding, No heptosplenomegaly  Genitourinary:  deferred Musculoskeletal: Normal range of motion. He exhibits no edema and no tenderness.       Small and large joints without redness, synovial thickening or deformity. Full range of motion preserved about all small, median and large joints.  Lymphadenopathy:    He has no cervical or supraclavicular adenopathy.  Neurological: He is alert and oriented to person, place, and time. CN II-XII intact. DTRs 2+ and symmetrical biceps, radial and patellar tendons. Cerebellar function normal with no tremor, rigidity, normal gait and station.  Skin: Skin is warm and dry. No rash noted. No erythema.  Psychiatric: He has a normal mood and affect. His behavior is normal. Thought content normal.  Lab Results  Component Value Date   WBC 6.3 05/21/2010   HGB 15.0 05/21/2010   HCT 44.7 05/21/2010   PLT 206.0 05/21/2010   GLUCOSE 104* 09/28/2012   CHOL 124 09/28/2012   TRIG 80.0 09/28/2012   HDL 47.70 09/28/2012   LDLCALC 60 09/28/2012        ALT 29 09/28/2012   AST 25 09/28/2012        NA 137 09/28/2012   K 4.7 09/28/2012   CL 105 09/28/2012   CREATININE 1.1 09/28/2012   BUN 21 09/28/2012   CO2 26 09/28/2012   TSH 1.30 05/21/2010   PSA 0.61 09/28/2012           Assessment & Plan:

## 2012-09-28 NOTE — Telephone Encounter (Signed)
Mailed pt's after visit summary since forgotten in the office.

## 2012-09-28 NOTE — Patient Instructions (Addendum)
Thanks for coming in. You  Look fine.  Labs today and results will be available on MyChart and I will include them in the note I send  Think of exercise as your job: walking plus flex/stretch  See you next year.

## 2012-09-29 NOTE — Assessment & Plan Note (Signed)
Lab reveals excellent control with LDL well below goal of 80 or less and HDL greater than 40+  Plan  Continue present medication

## 2012-09-29 NOTE — Assessment & Plan Note (Signed)
BP Readings from Last 3 Encounters:  09/28/12 138/90  02/12/12 122/64  01/23/12 130/82   Good control, even better at home by his report  Plan No change in treatment  Resume exercise - walking

## 2012-09-29 NOTE — Assessment & Plan Note (Addendum)
Stable. He follows with urology but hasn't been seen for several years, since '09. He is asymptomatic.  Plan He may want to consider follow up consult and surveillance cystoscopy which was recommended by Dr Retta Diones for 2010.

## 2012-09-29 NOTE — Assessment & Plan Note (Signed)
Interval history has been benign without any major illness, injury or surgery. Physical exam is normal. Lab results are in normal limits. He is current with colorectal and prostate cancer screening. Immunizations are brought up to date except for Shingles - he will check insurance coverage.   In summary - a very nice man who is medically stable. He needs to resume an exercise program, both aerobic (walking) and flex/stretch. Follow up with Dr. Retta Diones would be a good idea. He will return in 1 year or sooner as needed.

## 2012-11-23 ENCOUNTER — Other Ambulatory Visit: Payer: Self-pay | Admitting: Internal Medicine

## 2013-01-31 ENCOUNTER — Ambulatory Visit (INDEPENDENT_AMBULATORY_CARE_PROVIDER_SITE_OTHER): Payer: BC Managed Care – PPO | Admitting: Family Medicine

## 2013-01-31 VITALS — BP 126/82 | HR 76 | Temp 98.0°F | Resp 17 | Ht 69.5 in | Wt 206.0 lb

## 2013-01-31 DIAGNOSIS — H8110 Benign paroxysmal vertigo, unspecified ear: Secondary | ICD-10-CM

## 2013-01-31 LAB — COMPREHENSIVE METABOLIC PANEL
ALT: 19 U/L (ref 0–53)
AST: 22 U/L (ref 0–37)
Albumin: 4.4 g/dL (ref 3.5–5.2)
Alkaline Phosphatase: 99 U/L (ref 39–117)
BUN: 18 mg/dL (ref 6–23)
CO2: 26 mEq/L (ref 19–32)
Calcium: 9.5 mg/dL (ref 8.4–10.5)
Chloride: 103 mEq/L (ref 96–112)
Creat: 1.24 mg/dL (ref 0.50–1.35)
Glucose, Bld: 123 mg/dL — ABNORMAL HIGH (ref 70–99)
Potassium: 4.3 mEq/L (ref 3.5–5.3)
Sodium: 138 mEq/L (ref 135–145)
Total Bilirubin: 0.5 mg/dL (ref 0.3–1.2)
Total Protein: 7 g/dL (ref 6.0–8.3)

## 2013-01-31 MED ORDER — MECLIZINE HCL 25 MG PO TABS: 25.0000 mg | ORAL_TABLET | Freq: Three times a day (TID) | ORAL | Status: DC | PRN

## 2013-01-31 NOTE — Addendum Note (Signed)
Addended by: Cheri Kearns on: 01/31/2013 09:14 AM   Modules accepted: Orders

## 2013-01-31 NOTE — Progress Notes (Signed)
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  No diplopia.  PMHx:  Chronic mild hearing loss  Objective: Mild hearing loss requiring me to elevate my voice a little bit. No acute distress Ear: Normal external inspection, normal canal, normal TM Neck: Supple no bruit Heart: Regular no murmur Gait: Stable Neuro: Normal cranial nerves III through XII, normal movement arms and legs  Assessment: This appears to be positional vertigo although it's possible the patient has a low potassium causing this.  Plan: Stat electrolytes, meclizine  Signed, Elvina Sidle M.D.

## 2013-01-31 NOTE — Patient Instructions (Addendum)

## 2013-02-05 ENCOUNTER — Ambulatory Visit (INDEPENDENT_AMBULATORY_CARE_PROVIDER_SITE_OTHER): Payer: BC Managed Care – PPO

## 2013-02-05 DIAGNOSIS — Z23 Encounter for immunization: Secondary | ICD-10-CM

## 2013-02-05 DIAGNOSIS — Z2911 Encounter for prophylactic immunotherapy for respiratory syncytial virus (RSV): Secondary | ICD-10-CM

## 2013-02-10 ENCOUNTER — Other Ambulatory Visit: Payer: Self-pay | Admitting: Cardiology

## 2013-02-10 NOTE — Telephone Encounter (Signed)
I will forward this prescription to Dr Alvera Novel office for refills.  Dr Riley Kill has retired and the pt is following up with Cardiology as needed.

## 2013-05-13 ENCOUNTER — Other Ambulatory Visit: Payer: Self-pay | Admitting: Internal Medicine

## 2013-05-20 ENCOUNTER — Other Ambulatory Visit: Payer: Self-pay

## 2013-05-25 ENCOUNTER — Encounter: Payer: Self-pay | Admitting: Cardiology

## 2013-06-13 ENCOUNTER — Ambulatory Visit (INDEPENDENT_AMBULATORY_CARE_PROVIDER_SITE_OTHER): Payer: BC Managed Care – PPO | Admitting: Internal Medicine

## 2013-06-13 VITALS — BP 128/74 | HR 71 | Temp 98.0°F | Resp 18 | Ht 69.0 in | Wt 218.0 lb

## 2013-06-13 DIAGNOSIS — R1310 Dysphagia, unspecified: Secondary | ICD-10-CM

## 2013-06-13 MED ORDER — LIDOCAINE VISCOUS 2 % MT SOLN: OROMUCOSAL | Status: DC

## 2013-06-13 NOTE — Progress Notes (Signed)
Subjective:    Patient ID: [name], male    DOB: 02-25-1946, 67 y.o.   MRN: [id number]  This chart was scribed for Ellamae Sia, MD by Greggory Stallion, Medical Scribe. This patient's care was started at 6:26 PM.  HPI HPI Comments: [name] is a 67 y.o. male who presents to the office complaining of sore throat and cough that started a few hours ago when he inhaled some cleaning chemicals. Pt is also having rhinorrhea. Denies other trouble swallowing, trouble breathing, chest tightness.   Patient Active Problem List   Diagnosis Date Noted  . Internal hemorrhoids without mention of complication 01/08/2012  . Neck pain 10/23/2011  . Routine health maintenance 09/29/2011  . 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 DISC DISEASE, LUMBAR 05/05/2009  . HYPERCHOLESTEROLEMIA 03/25/2009  . HEARING LOSS 03/25/2009  . HYPERTENSION, MILD 03/25/2009   Past Medical History  Diagnosis Date  . Degeneration of lumbar or lumbosacral intervertebral disc   . Hypertension     mild  . Pure hypercholesterolemia   . Unspecified hearing loss   . Hemorrhoids    Past Surgical History  Procedure Laterality Date  . Tonsillectomy    . Transurethral resection of bladder tumor  1999    benign polyp   No Known Allergies Prior to Admission medications   Medication Sig Start Date End Date Taking? Authorizing Provider  aspirin EC 81 MG tablet Take one tablet by mouth two times a day.   Yes Historical Provider, MD  atorvastatin (LIPITOR) 40 MG tablet TAKE 1 TABLET ONCE DAILY. 02/10/13  Yes Jacques Navy, MD  furosemide (LASIX) 20 MG tablet TAKE 1 TABLET ONCE DAILY. 05/13/13  Yes Jacques Navy, MD  Multiple Vitamin (MULTIVITAMIN) tablet Take 1 tablet by mouth 2 (two) times daily.    Yes Historical Provider, MD  meclizine (ANTIVERT) 25 MG tablet Take 1 tablet (25 mg total) by mouth 3 (three) times daily as needed. 01/31/13   Elvina Sidle, MD   History   Social History  . Marital Status:  Married    Spouse Name: N/A    Number of Children: 2  . Years of Education: N/A   Occupational History  . Systems [employer]   Social History Main Topics  . Smoking status: Former Smoker -- 15 years    Types: Cigarettes  . Smokeless tobacco: Never Used  . Alcohol Use: No  . Drug Use: No  . Sexual Activity: Yes    Partners: Female   Other Topics Concern  . Not on file   Social History Narrative   [employer], [place]-undergrad. Married- 1987. 2 sons-'91 [employer], '94-headed for [employer]. 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    Review of Systems  HENT: Positive for rhinorrhea and sore throat.   Respiratory: Positive for cough. Negative for chest tightness.        Objective:   Physical Exam  Vitals reviewed. Constitutional: He is oriented to person, place, and time. He appears well-developed and well-nourished. No distress.  HENT:  Head: Normocephalic and atraumatic.  Nose: Nose normal.  Throat has redness with vesicles over posterior pharynx.   Eyes: Conjunctivae and EOM are normal. Pupils are equal, round, and reactive to light.  Neck: Neck supple. No thyromegaly present.  Cardiovascular: Normal rate, regular rhythm and normal heart sounds.   No murmur heard. Pulmonary/Chest: Effort normal and breath sounds normal. No respiratory distress. He has no wheezes. He has no rales.  Musculoskeletal: Normal range of motion.  Lymphadenopathy:    He has no cervical adenopathy.  Neurological: He is alert and oriented to person, place, and time.  Skin: Skin is warm and dry.  Psychiatric: He has a normal mood and affect. His behavior is normal.    Filed Vitals:   06/13/13 1816  BP: 128/74  Pulse: 71  Temp: 98 F (36.7 C)  TempSrc: Oral  Resp: 18  Height: 5\' 9"  (1.753 m)  Weight: 218 lb (98.884 kg)  SpO2: 99%       Assessment & Plan:  6:29 PM-Discussed treatment plan which includes magic mouth wash with pt  at bedside and pt agreed to plan.    I have completed the patient encounter in its entirety as documented by the scribe, with editing by me where necessary. Jadaya Sommerfield P. Merla Riches, M.D.  Chemical burn post pharynx---vis xylo/menthol/f/u 24 hr if worse

## 2013-09-29 ENCOUNTER — Other Ambulatory Visit (INDEPENDENT_AMBULATORY_CARE_PROVIDER_SITE_OTHER): Payer: BC Managed Care – PPO

## 2013-09-29 ENCOUNTER — Encounter: Payer: Self-pay | Admitting: Internal Medicine

## 2013-09-29 ENCOUNTER — Ambulatory Visit (INDEPENDENT_AMBULATORY_CARE_PROVIDER_SITE_OTHER): Payer: BC Managed Care – PPO | Admitting: Internal Medicine

## 2013-09-29 VITALS — BP 140/98 | HR 73 | Ht 71.0 in | Wt 212.8 lb

## 2013-09-29 DIAGNOSIS — R6889 Other general symptoms and signs: Secondary | ICD-10-CM

## 2013-09-29 DIAGNOSIS — R0609 Other forms of dyspnea: Secondary | ICD-10-CM

## 2013-09-29 DIAGNOSIS — E7800 Pure hypercholesterolemia, unspecified: Secondary | ICD-10-CM

## 2013-09-29 DIAGNOSIS — Z23 Encounter for immunization: Secondary | ICD-10-CM

## 2013-09-29 DIAGNOSIS — I10 Essential (primary) hypertension: Secondary | ICD-10-CM

## 2013-09-29 DIAGNOSIS — R739 Hyperglycemia, unspecified: Secondary | ICD-10-CM

## 2013-09-29 DIAGNOSIS — Z0000 Encounter for general adult medical examination without abnormal findings: Secondary | ICD-10-CM

## 2013-09-29 DIAGNOSIS — R7309 Other abnormal glucose: Secondary | ICD-10-CM

## 2013-09-29 DIAGNOSIS — R0989 Other specified symptoms and signs involving the circulatory and respiratory systems: Secondary | ICD-10-CM

## 2013-09-29 LAB — COMPREHENSIVE METABOLIC PANEL
ALT: 25 U/L (ref 0–53)
AST: 21 U/L (ref 0–37)
Albumin: 4.4 g/dL (ref 3.5–5.2)
Alkaline Phosphatase: 88 U/L (ref 39–117)
BILIRUBIN TOTAL: 0.7 mg/dL (ref 0.3–1.2)
BUN: 20 mg/dL (ref 6–23)
CALCIUM: 9.5 mg/dL (ref 8.4–10.5)
CHLORIDE: 102 meq/L (ref 96–112)
CO2: 28 meq/L (ref 19–32)
CREATININE: 1.1 mg/dL (ref 0.4–1.5)
GFR: 71.04 mL/min (ref >60.00)
Glucose, Bld: 107 mg/dL — ABNORMAL HIGH (ref 70–99)
Potassium: 4.6 mEq/L (ref 3.5–5.1)
Sodium: 138 mEq/L (ref 135–145)
Total Protein: 7.6 g/dL (ref 6.0–8.3)

## 2013-09-29 LAB — HEMOGLOBIN A1C: Hgb A1c MFr Bld: 6.4 % (ref 4.6–6.5)

## 2013-09-29 LAB — LIPID PANEL
CHOLESTEROL: 128 mg/dL (ref 0–200)
HDL: 50.3 mg/dL (ref >39.00)
LDL Cholesterol: 62 mg/dL (ref 0–99)
Total CHOL/HDL Ratio: 3
Triglycerides: 78 mg/dL (ref 0.0–149.0)
VLDL: 15.6 mg/dL (ref 0.0–40.0)

## 2013-09-29 NOTE — Patient Instructions (Signed)
Good to see you  With decreased exercise tolerance and shortness of breath with exertion we will get a stress test to rule out heart disease as a cause.  Routine lab today - results via [facility] - prevnar pneumonia vaccine today - once and done.  New doc - let me know - Dr. Santa Genera here in September, can consider docs at [facility].  If you really want to see someone here put in a strong request in person or directly to the doctor of choice.

## 2013-09-29 NOTE — Progress Notes (Signed)
Pre visit review using our clinic review tool, if applicable. No additional management support is needed unless otherwise documented below in the visit note. 

## 2013-09-29 NOTE — Progress Notes (Signed)
Subjective:    Patient ID: [name], male    DOB: [birth year], 68 y.o.   MRN: [id number]  HPI The patient is here for annual wellness examination and management of other chronic and acute problems.  Had a chemical exposure in November - cleaning products. Had angioedema that resolved w/o treatment but he was seen at Urgent Care - Dr. Laney Pastor.   Patient does report decreased exericse tolerance and DOE. Has high risk for CAD. Last study in 2002.   He does report loss of muscle mass. He doesn't work out.   He has a bone spur at the cephalad aspect of 1st metatarsal left.   The risk factors are reflected in the social history.  The roster of all physicians providing medical care to patient - is listed in the Snapshot section of the chart.  Activities of daily living:  The patient is 100% inedpendent in all ADLs: dressing, toileting, feeding as well as independent mobility  Home safety : The patient has smoke detectors in the home. They wear seatbelts. No firearms at home. There is no violence in the home.   There is no risks for hepatitis, STDs or HIV. There is no   history of blood transfusion. They have no travel history to infectious disease endemic areas of the world.  The patient has seen their dentist in the last six month. They have  seen their eye doctor in the last year. They deny any hearing difficulty and have not had audiologic testing in the last year.    They do not  have excessive sun exposure. Discussed the need for sun protection: hats, long sleeves and use of sunscreen if there is significant sun exposure.   Diet: the importance of a healthy diet is discussed. They do have a healthy (unhealthy-high fat/fast food) diet.  The patient has a regular exercise program: walk, ride bike , 30 duration, 5 per week.  The benefits of regular aerobic exercise were discussed.  Depression screen: there are no signs or vegative symptoms of depression- irritability, change in  appetite, anhedonia, sadness/tearfullness.  Cognitive assessment: the patient manages all their financial and personal affairs and is actively engaged.  The following portions of the patient's history were reviewed and updated as appropriate: allergies, current medications, past family history, past medical history,  past surgical history, past social history  and problem list.  Vision, hearing, body mass index were assessed and reviewed.   During the course of the visit the patient was educated and counseled about appropriate screening and preventive services including : fall prevention , diabetes screening, nutrition counseling, colorectal cancer screening, and recommended immunizations.    Review of Systems Constitutional:  Negative for fever, chills, activity change and unexpected weight change.  HEENT:  Negative for hearing loss, ear pain, congestion, neck stiffness and postnasal drip. Negative for sore throat or swallowing problems. Negative for dental complaints.   Eyes: Negative for vision loss or change in visual acuity.  Respiratory: Negative for chest tightness and wheezing. Negative for DOE.   Cardiovascular: Negative for chest pain or palpitations. No decreased exercise tolerance Gastrointestinal: No change in bowel habit. No bloating or gas. No reflux or indigestion Genitourinary: Negative for urgency, frequency, flank pain and difficulty urinating.  Musculoskeletal: Negative for myalgias, back pain, arthralgias and gait problem.  Neurological: Negative for dizziness, tremors, weakness and headaches.  Hematological: Negative for adenopathy.  Psychiatric/Behavioral: Negative for behavioral problems and dysphoric mood.       Objective:  Physical Exam Filed Vitals:   09/29/13 0857  BP: 140/98  Pulse: 73   Wt Readings from Last 3 Encounters:  09/29/13 212 lb 12.8 oz (96.525 kg)  06/13/13 218 lb (98.884 kg)  01/31/13 206 lb (93.441 kg)   Gen'l: Well nourished well  developed male in no acute distress  HEENT: Head: Normocephalic and atraumatic. Right Ear: External ear normal. EAC/TM nl. Left Ear: External ear normal.  EAC/TM nl. Nose: Nose normal. Mouth/Throat: Oropharynx is clear and moist. Dentition - native, in good repair. No buccal or palatal lesions. Posterior pharynx clear. Eyes: Conjunctivae and sclera clear. EOM intact. Pupils are equal, round, and reactive to light. Right eye exhibits no discharge. Left eye exhibits no discharge. Neck: Normal range of motion. Neck supple. No JVD present. No tracheal deviation present. No thyromegaly present.  Cardiovascular: Normal rate, regular rhythm, no gallop, no friction rub, no murmur heard.      Quiet precordium. 2+ radial and DP pulses . No carotid bruits Pulmonary/Chest: Effort normal. No respiratory distress or increased WOB, no wheezes, no rales. No chest wall deformity or CVAT. Abdomen: Soft. Bowel sounds are normal in all quadrants. He exhibits no distension, no tenderness, no rebound or guarding, No heptosplenomegaly  Genitourinary:  deferred Musculoskeletal: Normal range of motion. He exhibits no edema and no tenderness.       Small and large joints without redness, synovial thickening or deformity. Full range of motion preserved about all small, median and large joints.  Lymphadenopathy:    He has no cervical or supraclavicular adenopathy.  Neurological: He is alert and oriented to person, place, and time. CN II-XII intact. DTRs 2+ and symmetrical biceps, radial and patellar tendons. Cerebellar function normal with no tremor, rigidity, normal gait and station.  Skin: Skin is warm and dry. No rash noted. No erythema.  Psychiatric: He has a normal mood and affect. His behavior is normal. Thought content normal.   Recent Results (from the past 2160 hour(s))  COMPREHENSIVE METABOLIC PANEL     Status: Abnormal   Collection Time    09/29/13 10:44 AM      Result Value Ref Range   Sodium 138  135 - 145  mEq/L   Potassium 4.6  3.5 - 5.1 mEq/L   Chloride 102  96 - 112 mEq/L   CO2 28  19 - 32 mEq/L   Glucose, Bld 107 (*) 70 - 99 mg/dL   BUN 20  6 - 23 mg/dL   Creatinine, Ser 1.1  0.4 - 1.5 mg/dL   Total Bilirubin 0.7  0.3 - 1.2 mg/dL   Alkaline Phosphatase 88  39 - 117 U/L   AST 21  0 - 37 U/L   ALT 25  0 - 53 U/L   Total Protein 7.6  6.0 - 8.3 g/dL   Albumin 4.4  3.5 - 5.2 g/dL   Calcium 9.5  8.4 - 10.5 mg/dL   GFR 71.04  >60.00 mL/min  LIPID PANEL     Status: None   Collection Time    09/29/13 10:44 AM      Result Value Ref Range   Cholesterol 128  0 - 200 mg/dL   Comment: ATP III Classification       Desirable:  < 200 mg/dL               Borderline High:  200 - 239 mg/dL          High:  > = 240 mg/dL   Triglycerides  78.0  0.0 - 149.0 mg/dL   Comment: Normal:  <150 mg/dLBorderline High:  150 - 199 mg/dL   HDL 50.30  >39.00 mg/dL   VLDL 15.6  0.0 - 40.0 mg/dL   LDL Cholesterol 62  0 - 99 mg/dL   Total CHOL/HDL Ratio 3     Comment:                Men          Women1/2 Average Risk     3.4          3.3Average Risk          5.0          4.42X Average Risk          9.6          7.13X Average Risk          15.0          11.0                      HEMOGLOBIN A1C     Status: None   Collection Time    09/29/13 10:44 AM      Result Value Ref Range   Hemoglobin A1C 6.4  4.6 - 6.5 %   Comment: Glycemic Control Guidelines for People with Diabetes:Non Diabetic:  <6%Goal of Therapy: <7%Additional Action Suggested:  >8%            Assessment & Plan:  Cardiac risk - patient had been followed by Dr. Lia Foyer with last visit in 2013. He has multiple cardiac risk factors: age, gender, family history, hypertension, hyperlipidemia, overweight. He does report noticeable decreased exercise tolerance, some discomfort with heavy physical exertion and DOE.  Plan Nuclear Stress test to r/o CAD/ischemia.

## 2013-09-30 ENCOUNTER — Telehealth: Payer: Self-pay | Admitting: Internal Medicine

## 2013-09-30 NOTE — Assessment & Plan Note (Signed)
Interval history with an episode of angioedema after exposure to household chemicals. He does report decreased exercise tolerance and DOE. Physical exam is normal. Lab results reviewed - in normal range. He is current with colorectal cancer screening and has aged out of prostate cancer screening (AUA April '13). Last PSA 2014 0.61. Immunizations - given Prevnar today and he should return for shingles vaccine. Decreased muscle mass - last testosterone level 2013 was normal.   In summary A nice man who appears medically stable except for cardiac risk with subtle changes in exercise tolerance - for Myoview stress test.

## 2013-09-30 NOTE — Assessment & Plan Note (Signed)
BP Readings from Last 3 Encounters:  09/29/13 140/98  06/13/13 128/74  01/31/13 126/82   Adequate control on present medication. Bmet - normal

## 2013-09-30 NOTE — Telephone Encounter (Signed)
Relevant patient education assigned to patient using Emmi. ° °

## 2013-09-30 NOTE — Assessment & Plan Note (Signed)
Taking and tolerating "statin" therapy. Lab reveals excellent control with  LDL 62, HDL 50. LFTs normal  Plan Continue present medications

## 2013-10-13 ENCOUNTER — Encounter (HOSPITAL_COMMUNITY): Payer: BC Managed Care – PPO

## 2013-11-04 ENCOUNTER — Ambulatory Visit (HOSPITAL_COMMUNITY): Payer: BC Managed Care – PPO | Attending: Internal Medicine | Admitting: Radiology

## 2013-11-04 VITALS — BP 142/98 | HR 61 | Ht 71.0 in | Wt 206.0 lb

## 2013-11-04 DIAGNOSIS — R6889 Other general symptoms and signs: Secondary | ICD-10-CM

## 2013-11-04 DIAGNOSIS — R0989 Other specified symptoms and signs involving the circulatory and respiratory systems: Principal | ICD-10-CM | POA: Insufficient documentation

## 2013-11-04 DIAGNOSIS — R0602 Shortness of breath: Secondary | ICD-10-CM

## 2013-11-04 DIAGNOSIS — R0609 Other forms of dyspnea: Secondary | ICD-10-CM | POA: Insufficient documentation

## 2013-11-04 MED ORDER — TECHNETIUM TC 99M SESTAMIBI GENERIC - CARDIOLITE
10.0000 | Freq: Once | INTRAVENOUS | Status: AC | PRN
  Administered 2013-11-04: 10 via INTRAVENOUS

## 2013-11-04 MED ORDER — TECHNETIUM TC 99M SESTAMIBI GENERIC - CARDIOLITE
30.0000 | Freq: Once | INTRAVENOUS | Status: AC | PRN
  Administered 2013-11-04: 30 via INTRAVENOUS

## 2013-11-04 NOTE — Progress Notes (Signed)
[facility] [address] [place], [place] [address] [phone]    Cardiology Nuclear Med Study  [name] is a 68 y.o. male     MRN : [id number]     DOB: 08/20/1945  Procedure Date: 11/04/2013  Nuclear Med Background Indication for Stress Test:  Evaluation for Ischemia History:  No prior known history of CAD, 2002 Myocardial Perfusion Imaging-Normal, EF=73% Cardiac Risk Factors: Family History - CAD, History of Smoking and Lipids  Symptoms:  Dizziness, DOE and Decease Exercise Tolerance   Nuclear Pre-Procedure Caffeine/Decaff Intake:  None NPO After: 7:00pm   Lungs:  clear O2 Sat: 98% on room air. IV 0.9% NS with Angio Cath:  22g  IV Site: R Antecubital  IV Started by:  Crissie Figures, RN  Chest Size (in):  46 Cup Size: n/a  Height: 5\' 11"  (1.803 m)  Weight:  206 lb (93.441 kg)  BMI:  Body mass index is 28.74 kg/(m^2). Tech Comments: Patient held Lasix this am    Nuclear Med Study 1 or 2 day study: 1 day  Stress Test Type:  Stress  Reading MD: N/A  Order Authorizing Provider:  Adella Hare, MD  Resting Radionuclide: Technetium 37m Sestamibi  Resting Radionuclide Dose: 11.0 mCi   Stress Radionuclide:  Technetium 23m Sestamibi  Stress Radionuclide Dose: 33.0 mCi           Stress Protocol Rest HR: 61 Stress HR: 134  Rest BP: 142/98 Stress BP: 184/103  Exercise Time (min): 9:00 METS: 10.1   Predicted Max HR: 150 bpm % Max HR: 89.33 bpm Rate Pressure Product: 24656   Dose of Adenosine (mg):  n/a Dose of Lexiscan: n/a mg  Dose of Atropine (mg): n/a Dose of Dobutamine: n/a mcg/kg/min (at max HR)  Stress Test Technologist: Irven Baltimore, RN  Nuclear Technologist:  Charlton Amor, CNMT     Rest Procedure:  Myocardial perfusion imaging was performed at rest 45 minutes following the intravenous administration of Technetium 61m Sestamibi. Rest ECG: NSR - Normal EKG  Stress Procedure:  The patient exercised on the treadmill utilizing the  Bruce Protocol for 9:00 minutes, RPE=15. The patient stopped due to DOE and denied any chest pain.There was a mild hypertensive response to exercise.  Technetium 63m Sestamibi was injected at peak exercise and myocardial perfusion imaging was performed after a brief delay. Stress ECG: No significant ST segment change suggestive of ischemia.  QPS Raw Data Images:  Acquisition technically good; normal left ventricular size. Stress Images:  There is decreased uptake in the inferior wall. Rest Images:  There is decreased uptake in the inferior wall. Subtraction (SDS):  No evidence of ischemia. Transient Ischemic Dilatation (Normal <1.22):  0.85 Lung/Heart Ratio (Normal <0.45):  0.29  Quantitative Gated Spect Images QGS EDV:  81 ml QGS ESV:  28 ml  Impression Exercise Capacity:  Good exercise capacity. BP Response:  Hypertensive blood pressure response. Clinical Symptoms:  There is dyspnea. ECG Impression:  No significant ST segment change suggestive of ischemia. Comparison with Prior Nuclear Study: Inferior defect new since 04/02/01.  Overall Impression:  Normal stress nuclear study with a small, mild intensity, fixed inferior defect consistent with thinning; no ischemia.  LV Ejection Fraction: 66%.  LV Wall Motion:  NL LV Function; NL Wall Motion   [name]

## 2014-02-11 ENCOUNTER — Other Ambulatory Visit: Payer: Self-pay | Admitting: *Deleted

## 2014-02-11 MED ORDER — ATORVASTATIN CALCIUM 40 MG PO TABS: ORAL_TABLET | ORAL | Status: DC

## 2014-03-15 ENCOUNTER — Telehealth: Payer: Self-pay | Admitting: Internal Medicine

## 2014-03-15 NOTE — Telephone Encounter (Signed)
Patient is a former [facility] patient.  States he was assigned to Dr. Ronnald Ramp.  He has not seen Dr. Ronnald Ramp.  He is requesting for you to take him on as his primary provider, b/c you are also his brothers PCP.  Please advise.

## 2014-03-15 NOTE — Telephone Encounter (Signed)
OK. Thx

## 2014-03-17 NOTE — Telephone Encounter (Signed)
ok Thank you!

## 2014-03-17 NOTE — Telephone Encounter (Signed)
Called patient scheduled cpe in March 2016.  He is now requesting for you to take his son on as well.  Please advise.

## 2014-03-18 NOTE — Telephone Encounter (Signed)
Changed sons provider in system as well.

## 2014-04-29 ENCOUNTER — Other Ambulatory Visit: Payer: Self-pay

## 2014-10-03 ENCOUNTER — Other Ambulatory Visit (INDEPENDENT_AMBULATORY_CARE_PROVIDER_SITE_OTHER): Payer: BC Managed Care – PPO

## 2014-10-03 ENCOUNTER — Ambulatory Visit (INDEPENDENT_AMBULATORY_CARE_PROVIDER_SITE_OTHER): Payer: BC Managed Care – PPO | Admitting: Internal Medicine

## 2014-10-03 ENCOUNTER — Encounter: Payer: Self-pay | Admitting: Internal Medicine

## 2014-10-03 VITALS — BP 118/90 | HR 67 | Ht 70.5 in | Wt 217.0 lb

## 2014-10-03 DIAGNOSIS — H9193 Unspecified hearing loss, bilateral: Secondary | ICD-10-CM | POA: Diagnosis not present

## 2014-10-03 DIAGNOSIS — Z0000 Encounter for general adult medical examination without abnormal findings: Secondary | ICD-10-CM | POA: Diagnosis not present

## 2014-10-03 DIAGNOSIS — R739 Hyperglycemia, unspecified: Secondary | ICD-10-CM

## 2014-10-03 DIAGNOSIS — I10 Essential (primary) hypertension: Secondary | ICD-10-CM | POA: Diagnosis not present

## 2014-10-03 LAB — CBC WITH DIFFERENTIAL/PLATELET
Basophils Absolute: 0 10*3/uL (ref 0.0–0.1)
Basophils Relative: 0.5 % (ref 0.0–3.0)
Eosinophils Absolute: 0.2 10*3/uL (ref 0.0–0.7)
Eosinophils Relative: 3.1 % (ref 0.0–5.0)
HCT: 46.7 % (ref 39.0–52.0)
Hemoglobin: 15.5 g/dL (ref 13.0–17.0)
LYMPHS PCT: 34.9 % (ref 12.0–46.0)
Lymphs Abs: 2.7 10*3/uL (ref 0.7–4.0)
MCHC: 33.2 g/dL (ref 30.0–36.0)
MCV: 84.3 fl (ref 78.0–100.0)
MONO ABS: 0.8 10*3/uL (ref 0.1–1.0)
Monocytes Relative: 9.9 % (ref 3.0–12.0)
NEUTROS ABS: 3.9 10*3/uL (ref 1.4–7.7)
Neutrophils Relative %: 51.6 % (ref 43.0–77.0)
PLATELETS: 233 10*3/uL (ref 150.0–400.0)
RBC: 5.54 Mil/uL (ref 4.22–5.81)
RDW: 14.4 % (ref 11.5–15.5)
WBC: 7.7 10*3/uL (ref 4.0–10.5)

## 2014-10-03 LAB — URINALYSIS
Bilirubin Urine: NEGATIVE
Ketones, ur: NEGATIVE
LEUKOCYTES UA: NEGATIVE
NITRITE: NEGATIVE
SPECIFIC GRAVITY, URINE: 1.015 (ref 1.000–1.030)
Total Protein, Urine: NEGATIVE
UROBILINOGEN UA: 0.2 (ref 0.0–1.0)
Urine Glucose: NEGATIVE
pH: 6 (ref 5.0–8.0)

## 2014-10-03 LAB — LIPID PANEL
Cholesterol: 124 mg/dL (ref 0–200)
HDL: 42 mg/dL (ref >39.00)
LDL Cholesterol: 58 mg/dL (ref 0–99)
NonHDL: 82
Total CHOL/HDL Ratio: 3
Triglycerides: 120 mg/dL (ref 0.0–149.0)
VLDL: 24 mg/dL (ref 0.0–40.0)

## 2014-10-03 LAB — TSH: TSH: 2.41 u[IU]/mL (ref 0.35–4.50)

## 2014-10-03 LAB — HEPATIC FUNCTION PANEL
ALBUMIN: 4.1 g/dL (ref 3.5–5.2)
ALT: 19 U/L (ref 0–53)
AST: 18 U/L (ref 0–37)
Alkaline Phosphatase: 100 U/L (ref 39–117)
Bilirubin, Direct: 0.1 mg/dL (ref 0.0–0.3)
TOTAL PROTEIN: 7 g/dL (ref 6.0–8.3)
Total Bilirubin: 0.6 mg/dL (ref 0.2–1.2)

## 2014-10-03 LAB — PSA: PSA: 0.58 ng/mL (ref 0.10–4.00)

## 2014-10-03 LAB — BASIC METABOLIC PANEL
BUN: 20 mg/dL (ref 6–23)
CO2: 28 meq/L (ref 19–32)
Calcium: 9.2 mg/dL (ref 8.4–10.5)
Chloride: 106 mEq/L (ref 96–112)
Creatinine, Ser: 1.22 mg/dL (ref 0.40–1.50)
GFR: 62.2 mL/min (ref >60.00)
Glucose, Bld: 116 mg/dL — ABNORMAL HIGH (ref 70–99)
Potassium: 4.9 mEq/L (ref 3.5–5.1)
SODIUM: 139 meq/L (ref 135–145)

## 2014-10-03 LAB — HEMOGLOBIN A1C: Hgb A1c MFr Bld: 6.4 % (ref 4.6–6.5)

## 2014-10-03 MED ORDER — VITAMIN D 1000 UNITS PO TABS: 1000.0000 [IU] | ORAL_TABLET | Freq: Every day | ORAL | Status: AC

## 2014-10-03 NOTE — Assessment & Plan Note (Signed)
We discussed age appropriate health related issues, including available/recomended screening tests and vaccinations. We discussed a need for adhering to healthy diet and exercise. Labs/EKG were reviewed/ordered. All questions were answered.   

## 2014-10-03 NOTE — Progress Notes (Signed)
Pre visit review using our clinic review tool, if applicable. No additional management support is needed unless otherwise documented below in the visit note. 

## 2014-10-03 NOTE — Progress Notes (Signed)
Subjective:    HPI  The patient is here for a wellness exam. The patient has been doing well overall without major physical or psychological issues going on lately. The patient needs to address  chronic hypertension that has been well controlled with Furosemide.  Wt Readings from Last 3 Encounters:  10/03/14 217 lb (98.431 kg)  11/04/13 206 lb (93.441 kg)  09/29/13 212 lb 12.8 oz (96.525 kg)   BP Readings from Last 3 Encounters:  10/03/14 118/90  11/04/13 142/98  09/29/13 140/98     Review of Systems  Constitutional: Negative for appetite change, fatigue and unexpected weight change.  HENT: Positive for hearing loss. Negative for congestion, ear discharge, nosebleeds, sneezing, sore throat, trouble swallowing and voice change.   Eyes: Negative for itching and visual disturbance.  Respiratory: Negative for cough, chest tightness and wheezing.   Cardiovascular: Negative for chest pain, palpitations and leg swelling.  Gastrointestinal: Negative for nausea, diarrhea, blood in stool and abdominal distention.  Genitourinary: Positive for urgency and frequency. Negative for dysuria, hematuria, decreased urine volume and enuresis.  Musculoskeletal: Negative for back pain, joint swelling, gait problem and neck pain.  Skin: Negative for rash.  Neurological: Negative for dizziness, tremors, speech difficulty and weakness.  Psychiatric/Behavioral: Negative for suicidal ideas, confusion, sleep disturbance, dysphoric mood and agitation. The patient is not nervous/anxious.        Objective:   Physical Exam  Constitutional: He is oriented to person, place, and time. He appears well-developed and well-nourished. No distress.  HENT:  Head: Normocephalic and atraumatic.  Right Ear: External ear normal.  Left Ear: External ear normal.  Nose: Nose normal.  Mouth/Throat: Oropharynx is clear and moist. No oropharyngeal exudate.  Eyes: Conjunctivae and EOM are normal. Pupils are equal, round,  and reactive to light. Right eye exhibits no discharge. Left eye exhibits no discharge. No scleral icterus.  Neck: Normal range of motion. Neck supple. No JVD present. No tracheal deviation present. No thyromegaly present.  Cardiovascular: Normal rate, regular rhythm, normal heart sounds and intact distal pulses.  Exam reveals no gallop and no friction rub.   No murmur heard. Pulmonary/Chest: Effort normal and breath sounds normal. No stridor. No respiratory distress. He has no wheezes. He has no rales. He exhibits no tenderness.  Abdominal: Soft. Bowel sounds are normal. He exhibits no distension and no mass. There is no tenderness. There is no rebound and no guarding.  Genitourinary:  Pt declined - Dr Diona Fanti does it q 1 year  Musculoskeletal: Normal range of motion. He exhibits no edema or tenderness.  Lymphadenopathy:    He has no cervical adenopathy.  Neurological: He is alert and oriented to person, place, and time. He has normal reflexes. No cranial nerve deficit. He exhibits normal muscle tone. Coordination normal.  Skin: Skin is warm and dry. No rash noted. He is not diaphoretic. No erythema. No pallor.  Psychiatric: He has a normal mood and affect. His behavior is normal. Judgment and thought content normal.    Lab Results  Component Value Date   WBC 6.3 05/21/2010   HGB 15.0 05/21/2010   HCT 44.7 05/21/2010   PLT 206.0 05/21/2010   GLUCOSE 107* 09/29/2013   CHOL 128 09/29/2013   TRIG 78.0 09/29/2013   HDL 50.30 09/29/2013   LDLCALC 62 09/29/2013   ALT 25 09/29/2013   AST 21 09/29/2013   NA 138 09/29/2013   K 4.6 09/29/2013   CL 102 09/29/2013   CREATININE 1.1 09/29/2013  BUN 20 09/29/2013   CO2 28 09/29/2013   TSH 1.30 05/21/2010   PSA 0.61 09/28/2012   HGBA1C 6.4 09/29/2013           Assessment & Plan:

## 2014-10-03 NOTE — Assessment & Plan Note (Signed)
Furosemide) x long time

## 2014-10-03 NOTE — Assessment & Plan Note (Signed)
Mild x years Pt is not interested in seeing a specialist

## 2014-10-06 ENCOUNTER — Telehealth: Payer: Self-pay | Admitting: *Deleted

## 2014-10-06 NOTE — Telephone Encounter (Signed)
DOT physical form completed/upfront for p/u. Pt informed

## 2014-11-01 ENCOUNTER — Ambulatory Visit (INDEPENDENT_AMBULATORY_CARE_PROVIDER_SITE_OTHER): Payer: Self-pay | Admitting: Family Medicine

## 2014-11-01 VITALS — BP 116/78 | HR 71 | Temp 98.0°F | Resp 18 | Ht 69.0 in | Wt 216.0 lb

## 2014-11-01 DIAGNOSIS — Z024 Encounter for examination for driving license: Secondary | ICD-10-CM

## 2014-11-01 DIAGNOSIS — Z029 Encounter for administrative examinations, unspecified: Secondary | ICD-10-CM

## 2014-11-01 NOTE — Progress Notes (Signed)
DOT physical examination  History: 69 year old man who is here for his DOT physical examination. 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 He has been quite healthy. He has a regular doctor. He is treated for his high cholesterol and on some furosemide for fluid. Does not carry label of hypertension. He has been quite healthy. He just got hearing aids yesterday, and is trying to figure out how he can use them.  Past medical history: Operations: Small bladder cancer Major illnesses: None. Has a history of high cholesterol Regular medications: Furosemide and cholesterol medicine Medication allergies: None known  Social history: As above. 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  Review of systems: Unremarkable  Physical examination: Pleasant 69 year old man, fully alert and oriented. No acute distress. TMs appear normal. He has the new hearing aids as noted above. Throat clear. Eyes PERRLA. Fundi benign. Neck supple without nodes thyromegaly. Chest clear. Heart regular without murmurs. Abdomen soft without mass or tenderness. Normal male external genitalia without hernias. Spine normal. Skin normal. Deep tendon reflexes 1+ symmetrical. Extremities without edema today.  Assessment: Normal DOT physical examination  Plan: 2 year DOT card

## 2014-11-01 NOTE — Patient Instructions (Signed)
Continue your regular care with your primary care physician  2 year card. Recommend wearing the hearing aids and glasses at all times when traveling and driving.  Return as needed

## 2014-11-10 ENCOUNTER — Other Ambulatory Visit: Payer: Self-pay | Admitting: *Deleted

## 2014-11-10 MED ORDER — FUROSEMIDE 20 MG PO TABS: 20.0000 mg | ORAL_TABLET | Freq: Every day | ORAL | Status: DC

## 2014-12-22 ENCOUNTER — Ambulatory Visit (INDEPENDENT_AMBULATORY_CARE_PROVIDER_SITE_OTHER): Payer: BC Managed Care – PPO | Admitting: Emergency Medicine

## 2014-12-22 VITALS — BP 128/86 | HR 69 | Temp 99.2°F | Resp 17 | Ht 69.0 in | Wt 207.8 lb

## 2014-12-22 DIAGNOSIS — F40243 Fear of flying: Secondary | ICD-10-CM

## 2014-12-22 MED ORDER — ALPRAZOLAM 0.5 MG PO TABS: 0.5000 mg | ORAL_TABLET | Freq: Three times a day (TID) | ORAL | Status: DC | PRN

## 2014-12-22 NOTE — Patient Instructions (Signed)
Generalized Anxiety Disorder Generalized anxiety disorder (GAD) is a mental disorder. It interferes with life functions, including relationships, work, and school. GAD is different from normal anxiety, which everyone experiences at some point in their lives in response to specific life events and activities. Normal anxiety actually helps us prepare for and get through these life events and activities. Normal anxiety goes away after the event or activity is over.  GAD causes anxiety that is not necessarily related to specific events or activities. It also causes excess anxiety in proportion to specific events or activities. The anxiety associated with GAD is also difficult to control. GAD can vary from mild to severe. People with severe GAD can have intense waves of anxiety with physical symptoms (panic attacks).  SYMPTOMS The anxiety and worry associated with GAD are difficult to control. This anxiety and worry are related to many life events and activities and also occur more days than not for 6 months or longer. People with GAD also have three or more of the following symptoms (one or more in children):  Restlessness.   Fatigue.  Difficulty concentrating.   Irritability.  Muscle tension.  Difficulty sleeping or unsatisfying sleep. DIAGNOSIS GAD is diagnosed through an assessment by your health care provider. Your health care provider will ask you questions aboutyour mood,physical symptoms, and events in your life. Your health care provider may ask you about your medical history and use of alcohol or drugs, including prescription medicines. Your health care provider may also do a physical exam and blood tests. Certain medical conditions and the use of certain substances can cause symptoms similar to those associated with GAD. Your health care provider may refer you to a mental health specialist for further evaluation. TREATMENT The following therapies are usually used to treat GAD:    Medication. Antidepressant medication usually is prescribed for long-term daily control. Antianxiety medicines may be added in severe cases, especially when panic attacks occur.   Talk therapy (psychotherapy). Certain types of talk therapy can be helpful in treating GAD by providing support, education, and guidance. A form of talk therapy called cognitive behavioral therapy can teach you healthy ways to think about and react to daily life events and activities.  Stress managementtechniques. These include yoga, meditation, and exercise and can be very helpful when they are practiced regularly. A mental health specialist can help determine which treatment is best for you. Some people see improvement with one therapy. However, other people require a combination of therapies. Document Released: 10/26/2012 Document Revised: 11/15/2013 Document Reviewed: 10/26/2012 ExitCare Patient Information 2015 [employer]. This information is not intended to replace advice given to you by your health care provider. Make sure you discuss any questions you have with your health care provider.  

## 2014-12-22 NOTE — Progress Notes (Signed)
Subjective:  Patient ID: [name], male    DOB: [birth year]  Age: 69 y.o. MRN: [id number]  CC: New medication   HPI [name] presents  with severe flying. He is scheduled to fly to [place] to visit son studying [place] has concerns about spending 9 hours in an airplane and once medication helped him relax"  History Anzel has a past medical history of Degeneration of lumbar or lumbosacral intervertebral disc; Hypertension; Pure hypercholesterolemia; Unspecified hearing loss; and Hemorrhoids.   He has past surgical history that includes Tonsillectomy and Transurethral resection of bladder tumor (1999).   His  family history includes Colon cancer (age of onset: 21) in his cousin; Heart attack in his mother; Heart attack (age of onset: 37) in his brother; Heart attack (age of onset: 8) in his father; Heart disease in his brother, father, and mother; Hyperlipidemia in his mother and other; Hypertension in his other. There is no history of Lung cancer, Prostate cancer, Diabetes, or Cancer.  He   reports that he has quit smoking. His smoking use included Cigarettes. He quit after 15 years of use. He has never used smokeless tobacco. He reports that he does not drink alcohol or use illicit drugs.  Outpatient Prescriptions Prior to Visit  Medication Sig Dispense Refill  . aspirin EC 81 MG tablet Take one tablet by mouth two times a day.    Marland Kitchen atorvastatin (LIPITOR) 40 MG tablet TAKE 1 TABLET ONCE DAILY. 90 tablet 2  . Multiple Vitamin (MULTIVITAMIN) tablet Take 1 tablet by mouth 2 (two) times daily.     . cholecalciferol (VITAMIN D) 1000 UNITS tablet Take 1 tablet (1,000 Units total) by mouth daily. (Patient not taking: Reported on 12/22/2014) 100 tablet 3  . furosemide (LASIX) 20 MG tablet Take 1 tablet (20 mg total) by mouth daily. 90 tablet 3   No facility-administered medications prior to visit.    History   Social History  . Marital Status: Married    Spouse Name: N/A  .  Number of Children: 2  . Years of Education: N/A   Occupational History  . Doctor, [employer] Employ[employer]   Social History Main Topics  . Smoking status: Former Smoker -- 15 years    Types: Cigarettes  . Smokeless tobacco: Never Used  . Alcohol Use: No  . Drug Use: No  . Sexual Activity:    Partners: Female   Other Topics Concern  . None   Social History Narrative   HSG, [employer]-undergrad. Married- 1987. 2 sons-'91 [employer], '94-headed for [employer]. 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     Review of Systems  Constitutional: Negative for fever, chills and appetite change.  HENT: Negative for congestion, ear pain, postnasal drip, sinus pressure and sore throat.   Eyes: Negative for pain and redness.  Respiratory: Negative for cough, shortness of breath and wheezing.   Cardiovascular: Negative for leg swelling.  Gastrointestinal: Negative for nausea, vomiting, abdominal pain, diarrhea, constipation and blood in stool.  Endocrine: Negative for polyuria.  Genitourinary: Negative for dysuria, urgency, frequency and flank pain.  Musculoskeletal: Negative for gait problem.  Skin: Negative for rash.  Neurological: Negative for weakness and headaches.  Psychiatric/Behavioral: Negative for confusion and decreased concentration. The patient is not nervous/anxious.     Objective:  BP 128/86 mmHg  Pulse 69  Temp(Src) 99.2 F (37.3 C) (Oral)  Resp 17  Ht 5\' 9"  (1.753 m)  Wt 207 lb 12.8 oz (  94.257 kg)  BMI 30.67 kg/m2  SpO2 97%  Physical Exam  Constitutional: He is oriented to person, place, and time. He appears well-developed and well-nourished.  HENT:  Head: Normocephalic and atraumatic.  Eyes: Conjunctivae are normal. Pupils are equal, round, and reactive to light.  Pulmonary/Chest: Effort normal.  Musculoskeletal: He exhibits no edema.  Neurological: He is alert and oriented to person, place, and time.  Skin: Skin  is dry.  Psychiatric: He has a normal mood and affect. His behavior is normal. Thought content normal.      Assessment & Plan:   Jevante was seen today for new medication.  Diagnoses and all orders for this visit:  Fear of flying  Other orders -     Discontinue: ALPRAZolam (XANAX) 0.5 MG tablet; Take 1 tablet (0.5 mg total) by mouth 3 (three) times daily as needed for anxiety. -     ALPRAZolam (XANAX) 0.5 MG tablet; Take 1 tablet (0.5 mg total) by mouth 3 (three) times daily as needed for anxiety.   I am having Mr. Spalla maintain his aspirin EC, multivitamin, atorvastatin, cholecalciferol, furosemide, and ALPRAZolam.  Meds ordered this encounter  Medications  . DISCONTD: ALPRAZolam (XANAX) 0.5 MG tablet    Sig: Take 1 tablet (0.5 mg total) by mouth 3 (three) times daily as needed for anxiety.    Dispense:  10 tablet    Refill:  0  . ALPRAZolam (XANAX) 0.5 MG tablet    Sig: Take 1 tablet (0.5 mg total) by mouth 3 (three) times daily as needed for anxiety.    Dispense:  10 tablet    Refill:  0    Appropriate red flag conditions were discussed with the patient as well as actions that should be taken.  Patient expressed his understanding.  Follow-up: Return if symptoms worsen or fail to improve.  Roselee Culver, MD

## 2015-03-04 ENCOUNTER — Ambulatory Visit (INDEPENDENT_AMBULATORY_CARE_PROVIDER_SITE_OTHER): Payer: BC Managed Care – PPO | Admitting: Family Medicine

## 2015-03-04 VITALS — BP 110/70 | HR 70 | Temp 97.7°F | Resp 18 | Ht 70.0 in | Wt 213.0 lb

## 2015-03-04 DIAGNOSIS — J029 Acute pharyngitis, unspecified: Secondary | ICD-10-CM | POA: Diagnosis not present

## 2015-03-04 LAB — POCT RAPID STREP A (OFFICE): RAPID STREP A SCREEN: NEGATIVE

## 2015-03-04 MED ORDER — AMOXICILLIN 500 MG PO CAPS: 1000.0000 mg | ORAL_CAPSULE | Freq: Two times a day (BID) | ORAL | Status: DC

## 2015-03-04 NOTE — Patient Instructions (Signed)

## 2015-03-04 NOTE — Progress Notes (Signed)
Subjective:    Patient ID: [name], male    DOB: Apr 26, 1946, 69 y.o.   MRN: [id number]  03/04/2015  Sore Throat   HPI This 69 y.o. male presents for evaluation of sore throat. Onset one week ago.  No fever/chills/sweats.  +ST diffuse; pain with swallow mild; no pain with talking but hoarse.  +nasal congestion; +rhinorrhea.  No cough.  No n/v/d.  No rash.  No sick contacts but works for the [employer].   Review of Systems  Constitutional: Negative for fever, chills, diaphoresis and fatigue.  HENT: Positive for congestion, rhinorrhea, sore throat, trouble swallowing and voice change. Negative for ear pain and postnasal drip.   Respiratory: Negative for cough and shortness of breath.   Gastrointestinal: Negative for nausea, vomiting, abdominal pain and diarrhea.  Skin: Negative for rash.  Neurological: Negative for headaches.    Past Medical History  Diagnosis Date  . Degeneration of lumbar or lumbosacral intervertebral disc   . Hypertension     mild  . Pure hypercholesterolemia   . Unspecified hearing loss   . Hemorrhoids    Past Surgical History  Procedure Laterality Date  . Tonsillectomy    . Transurethral resection of bladder tumor  1999    benign polyp   No Known Allergies  Social History   Social History  . Marital Status: Married    Spouse Name: N/A  . Number of Children: 2  . Years of Education: N/A   Occupational History  . Doctor, [employer] Self Employ[employer]   Social History Main Topics  . Smoking status: Former Smoker -- 15 years    Types: Cigarettes  . Smokeless tobacco: Never Used  . Alcohol Use: No  . Drug Use: No  . Sexual Activity:    Partners: Female   Other Topics Concern  . Not on file   Social History Narrative   HSG, [employer]-undergrad. Married- 1987. 2 sons-'91 [employer], '94-headed for [employer]. 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       Objective:    BP 110/70  mmHg  Pulse 70  Temp(Src) 97.7 F (36.5 C) (Oral)  Resp 18  Ht 5\' 10"  (1.778 m)  Wt 213 lb (96.616 kg)  BMI 30.56 kg/m2  SpO2 98% Physical Exam  Constitutional: He is oriented to person, place, and time. He appears well-developed and well-nourished. No distress.  HENT:  Head: Normocephalic and atraumatic.  Right Ear: Tympanic membrane, external ear and ear canal normal.  Left Ear: Tympanic membrane, external ear and ear canal normal.  Nose: Nose normal.  Mouth/Throat: Uvula is midline and mucous membranes are normal. Oropharyngeal exudate and posterior oropharyngeal erythema present. No posterior oropharyngeal edema or tonsillar abscesses.  Eyes: Conjunctivae and EOM are normal. Pupils are equal, round, and reactive to light.  Neck: Normal range of motion. Neck supple. Carotid bruit is not present. No thyromegaly present.  Cardiovascular: Normal rate, regular rhythm, normal heart sounds and intact distal pulses.  Exam reveals no gallop and no friction rub.   No murmur heard. Pulmonary/Chest: Effort normal and breath sounds normal. He has no wheezes. He has no rales.  Lymphadenopathy:    He has no cervical adenopathy.  Neurological: He is alert and oriented to person, place, and time. No cranial nerve deficit.  Skin: Skin is warm and dry. No rash noted. He is not diaphoretic.  Psychiatric: He has a normal mood and affect. His behavior is normal.  Nursing note and  vitals reviewed.  Results for orders placed or performed in visit on 03/04/15  POCT rapid strep A  Result Value Ref Range   Rapid Strep A Screen Negative Negative       Assessment & Plan:   1. Sore throat    -New. -Send throat culture. -Pt desires to treat empirically with abx while awaiting cx results. -Supportive care with rest, fluids, Ibuprofen or Tylenol. -RTC inability to swallow.   Meds ordered this encounter  Medications  . amoxicillin (AMOXIL) 500 MG capsule    Sig: Take 2 capsules (1,000 mg total)  by mouth 2 (two) times daily.    Dispense:  40 capsule    Refill:  0    No Follow-up on file.   Alleigh Mollica Elayne Guerin, M.D. [facility] [address] [place], [place]  [address] [phone] phone [phone] fax

## 2015-03-05 LAB — CULTURE, GROUP A STREP: ORGANISM ID, BACTERIA: NORMAL

## 2015-08-06 ENCOUNTER — Other Ambulatory Visit: Payer: Self-pay | Admitting: Internal Medicine

## 2015-08-28 ENCOUNTER — Encounter: Payer: Self-pay | Admitting: Internal Medicine

## 2015-08-28 ENCOUNTER — Ambulatory Visit (INDEPENDENT_AMBULATORY_CARE_PROVIDER_SITE_OTHER): Payer: BC Managed Care – PPO | Admitting: Internal Medicine

## 2015-08-28 VITALS — BP 112/70 | HR 70 | Wt 220.0 lb

## 2015-08-28 DIAGNOSIS — G4700 Insomnia, unspecified: Secondary | ICD-10-CM

## 2015-08-28 DIAGNOSIS — I10 Essential (primary) hypertension: Secondary | ICD-10-CM

## 2015-08-28 DIAGNOSIS — E7800 Pure hypercholesterolemia, unspecified: Secondary | ICD-10-CM

## 2015-08-28 NOTE — Progress Notes (Signed)
Pre visit review using our clinic review tool, if applicable. No additional management support is needed unless otherwise documented below in the visit note. 

## 2015-08-28 NOTE — Progress Notes (Signed)
Subjective:  Patient ID: [name], male    DOB: May 18, 1946  Age: 70 y.o. MRN: [id number]  CC: No chief complaint on file.   HPI CONFESOR CAPEL presents for BP 160/90, wt gain, insomnia  Outpatient Prescriptions Prior to Visit  Medication Sig Dispense Refill  . aspirin EC 81 MG tablet Take one tablet by mouth two times a day.    Marland Kitchen atorvastatin (LIPITOR) 40 MG tablet TAKE 1 TABLET ONCE DAILY. 90 tablet 1  . cholecalciferol (VITAMIN D) 1000 UNITS tablet Take 1 tablet (1,000 Units total) by mouth daily. 100 tablet 3  . furosemide (LASIX) 20 MG tablet Take 1 tablet (20 mg total) by mouth daily. 90 tablet 3  . Multiple Vitamin (MULTIVITAMIN) tablet Take 1 tablet by mouth 2 (two) times daily.     Marland Kitchen ALPRAZolam (XANAX) 0.5 MG tablet Take 1 tablet (0.5 mg total) by mouth 3 (three) times daily as needed for anxiety. (Patient not taking: Reported on 08/28/2015) 10 tablet 0  . amoxicillin (AMOXIL) 500 MG capsule Take 2 capsules (1,000 mg total) by mouth 2 (two) times daily. (Patient not taking: Reported on 08/28/2015) 40 capsule 0   No facility-administered medications prior to visit.    ROS Review of Systems  Constitutional: Negative for appetite change, fatigue and unexpected weight change.  HENT: Negative for congestion, nosebleeds, sneezing, sore throat and trouble swallowing.   Eyes: Negative for itching and visual disturbance.  Respiratory: Negative for cough.   Cardiovascular: Negative for chest pain, palpitations and leg swelling.  Gastrointestinal: Negative for nausea, diarrhea, blood in stool and abdominal distention.  Genitourinary: Negative for urgency, frequency and hematuria.  Musculoskeletal: Negative for back pain, joint swelling, gait problem and neck pain.  Skin: Negative for rash.  Neurological: Negative for dizziness, tremors, speech difficulty and weakness.  Psychiatric/Behavioral: Negative for suicidal ideas, sleep disturbance, dysphoric mood and agitation. The patient  is not nervous/anxious.     Objective:  BP 112/70 mmHg  Pulse 70  Wt 220 lb (99.791 kg)  SpO2 95%  BP Readings from Last 3 Encounters:  08/28/15 112/70  03/04/15 110/70  12/22/14 128/86    Wt Readings from Last 3 Encounters:  08/28/15 220 lb (99.791 kg)  03/04/15 213 lb (96.616 kg)  12/22/14 207 lb 12.8 oz (94.257 kg)    Physical Exam  Constitutional: He is oriented to person, place, and time. He appears well-developed. No distress.  NAD  HENT:  Mouth/Throat: Oropharynx is clear and moist.  Eyes: Conjunctivae are normal. Pupils are equal, round, and reactive to light.  Neck: Normal range of motion. No JVD present. No thyromegaly present.  Cardiovascular: Normal rate, regular rhythm, normal heart sounds and intact distal pulses.  Exam reveals no gallop and no friction rub.   No murmur heard. Pulmonary/Chest: Effort normal and breath sounds normal. No respiratory distress. He has no wheezes. He has no rales. He exhibits no tenderness.  Abdominal: Soft. Bowel sounds are normal. He exhibits no distension and no mass. There is no tenderness. There is no rebound and no guarding.  Musculoskeletal: Normal range of motion. He exhibits no edema or tenderness.  Lymphadenopathy:    He has no cervical adenopathy.  Neurological: He is alert and oriented to person, place, and time. He has normal reflexes. No cranial nerve deficit. He exhibits normal muscle tone. He displays a negative Romberg sign. Coordination and gait normal.  Skin: Skin is warm and dry. No rash noted.  Psychiatric: He has a normal mood  and affect. His behavior is normal. Judgment and thought content normal.    Lab Results  Component Value Date   WBC 7.7 10/03/2014   HGB 15.5 10/03/2014   HCT 46.7 10/03/2014   PLT 233.0 10/03/2014   GLUCOSE 116* 10/03/2014   CHOL 124 10/03/2014   TRIG 120.0 10/03/2014   HDL 42.00 10/03/2014   LDLCALC 58 10/03/2014   ALT 19 10/03/2014   AST 18 10/03/2014   NA 139 10/03/2014    K 4.9 10/03/2014   CL 106 10/03/2014   CREATININE 1.22 10/03/2014   BUN 20 10/03/2014   CO2 28 10/03/2014   TSH 2.41 10/03/2014   PSA 0.58 10/03/2014   HGBA1C 6.4 10/03/2014    No results found.  Assessment & Plan:   Diagnoses and all orders for this visit:  HYPERTENSION, MILD  I have discontinued Mr. Crute ALPRAZolam and amoxicillin. I am also having him maintain his aspirin EC, multivitamin, cholecalciferol, furosemide, and atorvastatin.  No orders of the defined types were placed in this encounter.     Follow-up: Return in about 3 months (around 11/25/2015) for a follow-up visit.  Walker Kehr, MD

## 2015-08-28 NOTE — Patient Instructions (Addendum)
Goal BP <130/85 Try Valerian root at night

## 2015-08-28 NOTE — Assessment & Plan Note (Signed)
On Furosemide q am NAS diet  wt loss

## 2015-08-30 DIAGNOSIS — G4700 Insomnia, unspecified: Secondary | ICD-10-CM | POA: Insufficient documentation

## 2015-08-30 NOTE — Assessment & Plan Note (Signed)
Medication  lipitor 40 mg

## 2015-08-30 NOTE — Assessment & Plan Note (Signed)
Tylenol pm prn

## 2015-11-06 ENCOUNTER — Other Ambulatory Visit: Payer: Self-pay | Admitting: Internal Medicine

## 2015-11-14 ENCOUNTER — Ambulatory Visit (INDEPENDENT_AMBULATORY_CARE_PROVIDER_SITE_OTHER): Payer: BC Managed Care – PPO | Admitting: Internal Medicine

## 2015-11-14 ENCOUNTER — Encounter: Payer: Self-pay | Admitting: Internal Medicine

## 2015-11-14 VITALS — BP 122/72 | HR 65 | Temp 97.7°F | Ht 70.0 in | Wt 214.0 lb

## 2015-11-14 DIAGNOSIS — I10 Essential (primary) hypertension: Secondary | ICD-10-CM | POA: Diagnosis not present

## 2015-11-14 DIAGNOSIS — F418 Other specified anxiety disorders: Secondary | ICD-10-CM | POA: Insufficient documentation

## 2015-11-14 DIAGNOSIS — Z7184 Encounter for health counseling related to travel: Secondary | ICD-10-CM | POA: Insufficient documentation

## 2015-11-14 DIAGNOSIS — Z7189 Other specified counseling: Secondary | ICD-10-CM | POA: Diagnosis not present

## 2015-11-14 DIAGNOSIS — Z23 Encounter for immunization: Secondary | ICD-10-CM

## 2015-11-14 DIAGNOSIS — G4700 Insomnia, unspecified: Secondary | ICD-10-CM

## 2015-11-14 MED ORDER — CIPROFLOXACIN HCL 500 MG PO TABS: 500.0000 mg | ORAL_TABLET | Freq: Two times a day (BID) | ORAL | Status: DC

## 2015-11-14 MED ORDER — ALPRAZOLAM 0.25 MG PO TABS: 0.2500 mg | ORAL_TABLET | Freq: Two times a day (BID) | ORAL | Status: DC

## 2015-11-14 MED ORDER — ZOLPIDEM TARTRATE 10 MG PO TABS: 5.0000 mg | ORAL_TABLET | Freq: Every evening | ORAL | Status: DC | PRN

## 2015-11-14 NOTE — Assessment & Plan Note (Signed)
5/17 Zolpidem prn  Potential benefits of a long term benzodiazepines  use as well as potential risks  and complications were explained to the patient and were aknowledged.

## 2015-11-14 NOTE — Assessment & Plan Note (Signed)
5/17 Xanax prn - trip to [place]  Potential benefits of a long term benzodiazepines  use as well as potential risks  and complications were explained to the patient and were aknowledged.

## 2015-11-14 NOTE — Assessment & Plan Note (Signed)
Furosemide.

## 2015-11-14 NOTE — Progress Notes (Signed)
Pre visit review using our clinic review tool, if applicable. No additional management support is needed unless otherwise documented below in the visit note. 

## 2015-11-14 NOTE — Assessment & Plan Note (Signed)
ASA Cipro OTC meds Compression socks Vaccinations today

## 2015-11-14 NOTE — Progress Notes (Signed)
Subjective:  Patient ID: ALVEY CHUKWU, male    DOB: [birth year]  Age: 70 y.o. MRN: [id number]  CC: No chief complaint on file.   HPI AAQIL MASSUCCI presents for HTN C/o anxiety, insomnia The pt is going to [place] on 6/1 - he is very anxious  Outpatient Prescriptions Prior to Visit  Medication Sig Dispense Refill  . aspirin EC 81 MG tablet Take one tablet by mouth two times a day.    Marland Kitchen atorvastatin (LIPITOR) 40 MG tablet TAKE 1 TABLET ONCE DAILY. 90 tablet 1  . furosemide (LASIX) 20 MG tablet TAKE 1 TABLET ONCE DAILY. 90 tablet 3  . Multiple Vitamin (MULTIVITAMIN) tablet Take 1 tablet by mouth 2 (two) times daily.      No facility-administered medications prior to visit.    ROS Review of Systems  Constitutional: Negative for appetite change, fatigue and unexpected weight change.  HENT: Negative for congestion, nosebleeds, sneezing, sore throat and trouble swallowing.   Eyes: Negative for itching and visual disturbance.  Respiratory: Negative for cough.   Cardiovascular: Negative for chest pain, palpitations and leg swelling.  Gastrointestinal: Negative for nausea, diarrhea, blood in stool and abdominal distention.  Genitourinary: Negative for frequency and hematuria.  Musculoskeletal: Negative for back pain, joint swelling, gait problem and neck pain.  Skin: Negative for rash.  Neurological: Negative for dizziness, tremors, speech difficulty and weakness.  Psychiatric/Behavioral: Positive for sleep disturbance. Negative for suicidal ideas, behavioral problems, dysphoric mood, decreased concentration and agitation. The patient is nervous/anxious.     Objective:  BP 122/72 mmHg  Pulse 65  Temp(Src) 97.7 F (36.5 C) (Oral)  Ht 5\' 10"  (1.778 m)  Wt 214 lb (97.07 kg)  BMI 30.71 kg/m2  SpO2 96%  BP Readings from Last 3 Encounters:  11/14/15 122/72  08/28/15 112/70  03/04/15 110/70    Wt Readings from Last 3 Encounters:  11/14/15 214 lb (97.07 kg)  08/28/15 220 lb  (99.791 kg)  03/04/15 213 lb (96.616 kg)    Physical Exam  Constitutional: He is oriented to person, place, and time. He appears well-developed. No distress.  NAD  HENT:  Mouth/Throat: Oropharynx is clear and moist.  Eyes: Conjunctivae are normal. Pupils are equal, round, and reactive to light.  Neck: Normal range of motion. No JVD present. No thyromegaly present.  Cardiovascular: Normal rate, regular rhythm, normal heart sounds and intact distal pulses.  Exam reveals no gallop and no friction rub.   No murmur heard. Pulmonary/Chest: Effort normal and breath sounds normal. No respiratory distress. He has no wheezes. He has no rales. He exhibits no tenderness.  Abdominal: Soft. Bowel sounds are normal. He exhibits no distension and no mass. There is no tenderness. There is no rebound and no guarding.  Musculoskeletal: Normal range of motion. He exhibits no edema or tenderness.  Lymphadenopathy:    He has no cervical adenopathy.  Neurological: He is alert and oriented to person, place, and time. He has normal reflexes. No cranial nerve deficit. He exhibits normal muscle tone. He displays a negative Romberg sign. Coordination and gait normal.  Skin: Skin is warm and dry. No rash noted.  Psychiatric: His behavior is normal. Judgment and thought content normal.  anxious  Lab Results  Component Value Date   WBC 7.7 10/03/2014   HGB 15.5 10/03/2014   HCT 46.7 10/03/2014   PLT 233.0 10/03/2014   GLUCOSE 116* 10/03/2014   CHOL 124 10/03/2014   TRIG 120.0 10/03/2014   HDL 42.00 10/03/2014  LDLCALC 58 10/03/2014   ALT 19 10/03/2014   AST 18 10/03/2014   NA 139 10/03/2014   K 4.9 10/03/2014   CL 106 10/03/2014   CREATININE 1.22 10/03/2014   BUN 20 10/03/2014   CO2 28 10/03/2014   TSH 2.41 10/03/2014   PSA 0.58 10/03/2014   HGBA1C 6.4 10/03/2014    No results found.  Assessment & Plan:   There are no diagnoses linked to this encounter. I am having Mr. Guntrum start on ALPRAZolam,  zolpidem, and ciprofloxacin. I am also having him maintain his aspirin EC, multivitamin, atorvastatin, and furosemide.  Meds ordered this encounter  Medications  . ALPRAZolam (XANAX) 0.25 MG tablet    Sig: Take 1-2 tablets (0.25-0.5 mg total) by mouth 2 (two) times daily.    Dispense:  60 tablet    Refill:  1  . zolpidem (AMBIEN) 10 MG tablet    Sig: Take 0.5-1 tablets (5-10 mg total) by mouth at bedtime as needed for sleep.    Dispense:  30 tablet    Refill:  1  . ciprofloxacin (CIPRO) 500 MG tablet    Sig: Take 1 tablet (500 mg total) by mouth 2 (two) times daily.    Dispense:  20 tablet    Refill:  0     Follow-up: No Follow-up on file.  Walker Kehr, MD

## 2015-11-27 ENCOUNTER — Ambulatory Visit: Payer: BC Managed Care – PPO | Admitting: Internal Medicine

## 2016-01-05 ENCOUNTER — Ambulatory Visit (INDEPENDENT_AMBULATORY_CARE_PROVIDER_SITE_OTHER): Payer: BC Managed Care – PPO

## 2016-01-05 DIAGNOSIS — Z23 Encounter for immunization: Secondary | ICD-10-CM

## 2016-02-05 ENCOUNTER — Other Ambulatory Visit: Payer: Self-pay | Admitting: Internal Medicine

## 2016-04-25 ENCOUNTER — Ambulatory Visit (INDEPENDENT_AMBULATORY_CARE_PROVIDER_SITE_OTHER): Payer: BC Managed Care – PPO

## 2016-04-25 DIAGNOSIS — Z23 Encounter for immunization: Secondary | ICD-10-CM | POA: Diagnosis not present

## 2016-05-21 ENCOUNTER — Ambulatory Visit (INDEPENDENT_AMBULATORY_CARE_PROVIDER_SITE_OTHER): Payer: BC Managed Care – PPO | Admitting: General Practice

## 2016-05-21 ENCOUNTER — Ambulatory Visit: Payer: BC Managed Care – PPO

## 2016-05-21 DIAGNOSIS — Z23 Encounter for immunization: Secondary | ICD-10-CM | POA: Diagnosis not present

## 2016-11-05 ENCOUNTER — Ambulatory Visit (INDEPENDENT_AMBULATORY_CARE_PROVIDER_SITE_OTHER): Payer: Self-pay | Admitting: Physician Assistant

## 2016-11-05 VITALS — BP 134/78 | HR 65 | Temp 97.7°F | Resp 18 | Ht 70.0 in | Wt 216.8 lb

## 2016-11-05 DIAGNOSIS — Z0289 Encounter for other administrative examinations: Secondary | ICD-10-CM

## 2016-11-05 NOTE — Progress Notes (Signed)
PRIMARY CARE AT [facility] [address], [place] [address] [phone]  Date:  11/05/2016   Name:  [name]   DOB:  December 15, 1945   MRN:  [id number]  PCP:  Walker Kehr, MD    History of Present Illness:  [name] is a 71 y.o. male patient who presents to PCP with  Chief Complaint  Patient presents with  . Employment Physical    DOT     No complaints or concerns. Patient reports a hx of using furosemide for fluid retention, and as needed.  He is currently not taking. He reports no diagnosis of hypertension  Patient Active Problem List   Diagnosis Date Noted  . Travel advice encounter 11/14/2015  . Situational anxiety 11/14/2015  . Insomnia 08/30/2015  . Well adult exam 10/03/2014  . Internal hemorrhoids without mention of complication 28/31/5176  . Neck pain 10/23/2011  . 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 [place] DISEASE, LUMBAR 05/05/2009  . HYPERCHOLESTEROLEMIA 03/25/2009  . Hearing loss 03/25/2009  . HYPERTENSION, MILD 03/25/2009    Past Medical History:  Diagnosis Date  . Degeneration of lumbar or lumbosacral intervertebral disc   . Hemorrhoids   . Hypertension    mild  . Pure hypercholesterolemia   . Unspecified hearing loss     Past Surgical History:  Procedure Laterality Date  . TONSILLECTOMY    . TRANSURETHRAL RESECTION OF BLADDER TUMOR  1999   benign polyp    Social History  Substance Use Topics  . Smoking status: Former Smoker    Years: 15.00    Types: Cigarettes  . Smokeless tobacco: Never Used  . Alcohol use No    Family History  Problem Relation Age of Onset  . Heart attack Mother     age 38  . Heart disease Mother     CHF  . Hyperlipidemia Mother   . Heart attack Father 10    died of cardiovascular diease  . Heart disease Father     CAD/MI  . Heart attack Brother 21    S/P CABG @ 28  . Heart disease Brother     CAD/MI@21 , CABG @ 28  . Colon cancer Cousin 51  . Hyperlipidemia Other   . Hypertension Other    . Lung cancer Neg Hx   . Prostate cancer Neg Hx   . Diabetes Neg Hx   . Cancer Neg Hx     No Known Allergies  Medication list has been reviewed and updated.  Current Outpatient Prescriptions on File Prior to Visit  Medication Sig Dispense Refill  . ALPRAZolam (XANAX) 0.25 MG tablet Take 1-2 tablets (0.25-0.5 mg total) by mouth 2 (two) times daily. 60 tablet 1  . aspirin EC 81 MG tablet Take one tablet by mouth two times a day.    Marland Kitchen atorvastatin (LIPITOR) 40 MG tablet TAKE 1 TABLET ONCE DAILY. 90 tablet 3  . ciprofloxacin (CIPRO) 500 MG tablet Take 1 tablet (500 mg total) by mouth 2 (two) times daily. 20 tablet 0  . furosemide (LASIX) 20 MG tablet TAKE 1 TABLET ONCE DAILY. 90 tablet 3  . Multiple Vitamin (MULTIVITAMIN) tablet Take 1 tablet by mouth 2 (two) times daily.     Marland Kitchen zolpidem (AMBIEN) 10 MG tablet Take 0.5-1 tablets (5-10 mg total) by mouth at bedtime as needed for sleep. 30 tablet 1   No current facility-administered medications on file prior to visit.     Review of Systems  Constitutional: Negative for chills  and fever.  HENT: Negative for ear discharge, ear pain and sore throat.   Eyes: Negative for blurred vision and double vision.  Respiratory: Negative for cough, shortness of breath and wheezing.   Cardiovascular: Negative for chest pain, palpitations and leg swelling.  Gastrointestinal: Negative for diarrhea, nausea and vomiting.  Genitourinary: Negative for dysuria, frequency and hematuria.  Skin: Negative for itching and rash.  Neurological: Negative for dizziness and headaches.   ROS otherwise unremarkable unless listed above.  Physical Examination: BP 134/78 (BP Location: Right Arm, Patient Position: Sitting, Cuff Size: Normal)   Pulse 65   Temp 97.7 F (36.5 C) (Oral)   Resp 18   Ht 5\' 10"  (1.778 m)   Wt 216 lb 12.8 oz (98.3 kg)   SpO2 97%   BMI 31.11 kg/m  Ideal Body Weight: Weight in (lb) to have BMI = 25: 173.9  Physical Exam  Constitutional:  He is oriented to person, place, and time. He appears well-developed and well-nourished. No distress.  HENT:  Head: Normocephalic and atraumatic.  Right Ear: Tympanic membrane, external ear and ear canal normal.  Left Ear: Tympanic membrane, external ear and ear canal normal.  Eyes: Conjunctivae and EOM are normal. Pupils are equal, round, and reactive to light.  Cardiovascular: Normal rate and regular rhythm.  Exam reveals no friction rub.   No murmur heard. Pulmonary/Chest: Effort normal. No respiratory distress. He has no wheezes.  Abdominal: Soft. Bowel sounds are normal. He exhibits no distension and no mass. There is no tenderness.  Musculoskeletal: Normal range of motion. He exhibits no edema or tenderness.  Neurological: He is alert and oriented to person, place, and time. He displays normal reflexes.  Skin: Skin is warm and dry. He is not diaphoretic.  Psychiatric: He has a normal mood and affect. His behavior is normal.  Urine dipstick normal  Visual Acuity Screening   Right eye Left eye Both eyes  Without correction:     With correction: 20/25 20/30 20/25   Comments: R 85 degrees  L 85 degrees  Able to distinguish colors  Hearing Screening Comments: The patient was able to hear a forced whisper from 10 feet.   Assessment and Plan: [name] is a 71 y.o. 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

## 2017-02-07 ENCOUNTER — Telehealth: Payer: Self-pay | Admitting: Internal Medicine

## 2017-02-10 MED ORDER — ATORVASTATIN CALCIUM 40 MG PO TABS: 40.0000 mg | ORAL_TABLET | Freq: Every day | ORAL | 0 refills | Status: DC

## 2017-02-10 MED ORDER — FUROSEMIDE 20 MG PO TABS: 20.0000 mg | ORAL_TABLET | Freq: Every day | ORAL | 0 refills | Status: DC

## 2017-02-10 NOTE — Telephone Encounter (Signed)
Notified pt 30 day script has been sent to [facility] must keep appt for future refills...[name]

## 2017-02-10 NOTE — Telephone Encounter (Signed)
Appointment made with Dr Alain Marion on Monday, 02/24/2017.

## 2017-02-10 NOTE — Addendum Note (Signed)
Addended by: Earnstine Regal on: 02/10/2017 09:23 AM   Modules accepted: Orders

## 2017-02-24 ENCOUNTER — Ambulatory Visit (INDEPENDENT_AMBULATORY_CARE_PROVIDER_SITE_OTHER): Payer: BC Managed Care – PPO | Admitting: Internal Medicine

## 2017-02-24 ENCOUNTER — Encounter: Payer: Self-pay | Admitting: Internal Medicine

## 2017-02-24 VITALS — BP 124/76 | HR 65 | Temp 97.6°F | Ht 70.0 in | Wt 215.0 lb

## 2017-02-24 DIAGNOSIS — F418 Other specified anxiety disorders: Secondary | ICD-10-CM | POA: Diagnosis not present

## 2017-02-24 DIAGNOSIS — I10 Essential (primary) hypertension: Secondary | ICD-10-CM

## 2017-02-24 DIAGNOSIS — E7800 Pure hypercholesterolemia, unspecified: Secondary | ICD-10-CM

## 2017-02-24 DIAGNOSIS — Z0000 Encounter for general adult medical examination without abnormal findings: Secondary | ICD-10-CM | POA: Diagnosis not present

## 2017-02-24 DIAGNOSIS — G4700 Insomnia, unspecified: Secondary | ICD-10-CM | POA: Diagnosis not present

## 2017-02-24 MED ORDER — TRIAZOLAM 0.25 MG PO TABS
0.2500 mg | ORAL_TABLET | Freq: Every evening | ORAL | 1 refills | Status: DC | PRN
Start: 2017-02-24 — End: 2017-07-25

## 2017-02-24 MED ORDER — FUROSEMIDE 20 MG PO TABS: 20.0000 mg | ORAL_TABLET | Freq: Every day | ORAL | 11 refills | Status: DC

## 2017-02-24 MED ORDER — ALPRAZOLAM 0.25 MG PO TABS: 0.2500 mg | ORAL_TABLET | Freq: Two times a day (BID) | ORAL | 1 refills | Status: DC

## 2017-02-24 MED ORDER — ATORVASTATIN CALCIUM 40 MG PO TABS: 40.0000 mg | ORAL_TABLET | Freq: Every day | ORAL | 11 refills | Status: DC

## 2017-02-24 NOTE — Assessment & Plan Note (Signed)
Furosemide prn 

## 2017-02-24 NOTE — Assessment & Plan Note (Signed)
On Lipitor 

## 2017-02-24 NOTE — Assessment & Plan Note (Signed)
Xanax prn - trip to [place]  Potential benefits of a long term benzodiazepines  use as well as potential risks  and complications were explained to the patient and were aknowledged.

## 2017-02-24 NOTE — Assessment & Plan Note (Addendum)
Zolpidem prn -d/c Triazolam prn

## 2017-02-24 NOTE — Progress Notes (Signed)
Subjective:  Patient ID: [name], male    DOB: [birth year]  Age: 71 y.o. MRN: [id number]  CC: No chief complaint on file.   HPI JJESUS DINGLEY presents for dyslipidemia, insomnia, anxiety f/u  Outpatient Medications Prior to Visit  Medication Sig Dispense Refill  . ALPRAZolam (XANAX) 0.25 MG tablet Take 1-2 tablets (0.25-0.5 mg total) by mouth 2 (two) times daily. 60 tablet 1  . aspirin EC 81 MG tablet Take one tablet by mouth two times a day.    Marland Kitchen atorvastatin (LIPITOR) 40 MG tablet Take 1 tablet (40 mg total) by mouth daily. Must keep August appt for future refills 30 tablet 0  . furosemide (LASIX) 20 MG tablet Take 1 tablet (20 mg total) by mouth daily. Must keep August appt for future refills 30 tablet 0  . Multiple Vitamin (MULTIVITAMIN) tablet Take 1 tablet by mouth 2 (two) times daily.     Marland Kitchen zolpidem (AMBIEN) 10 MG tablet Take 0.5-1 tablets (5-10 mg total) by mouth at bedtime as needed for sleep. 30 tablet 1  . ciprofloxacin (CIPRO) 500 MG tablet Take 1 tablet (500 mg total) by mouth 2 (two) times daily. 20 tablet 0   No facility-administered medications prior to visit.     ROS Review of Systems  Constitutional: Negative for appetite change, fatigue and unexpected weight change.  HENT: Negative for congestion, nosebleeds, sneezing, sore throat and trouble swallowing.   Eyes: Negative for itching and visual disturbance.  Respiratory: Negative for cough.   Cardiovascular: Negative for chest pain, palpitations and leg swelling.  Gastrointestinal: Negative for abdominal distention, blood in stool, diarrhea and nausea.  Genitourinary: Negative for frequency and hematuria.  Musculoskeletal: Negative for back pain, gait problem, joint swelling and neck pain.  Skin: Negative for rash.  Neurological: Negative for dizziness, tremors, speech difficulty and weakness.  Psychiatric/Behavioral: Positive for sleep disturbance. Negative for agitation, dysphoric mood and suicidal ideas.  The patient is nervous/anxious.     Objective:  BP 124/76 (BP Location: Left Arm, Patient Position: Sitting, Cuff Size: Large)   Pulse 65   Temp 97.6 F (36.4 C) (Oral)   Ht 5\' 10"  (1.778 m)   Wt 215 lb (97.5 kg)   SpO2 99%   BMI 30.85 kg/m   BP Readings from Last 3 Encounters:  02/24/17 124/76  11/05/16 134/78  11/14/15 122/72    Wt Readings from Last 3 Encounters:  02/24/17 215 lb (97.5 kg)  11/05/16 216 lb 12.8 oz (98.3 kg)  11/14/15 214 lb (97.1 kg)    Physical Exam  Constitutional: He is oriented to person, place, and time. He appears well-developed. No distress.  NAD  HENT:  Mouth/Throat: Oropharynx is clear and moist.  Eyes: Pupils are equal, round, and reactive to light. Conjunctivae are normal.  Neck: Normal range of motion. No JVD present. No thyromegaly present.  Cardiovascular: Normal rate, regular rhythm, normal heart sounds and intact distal pulses.  Exam reveals no gallop and no friction rub.   No murmur heard. Pulmonary/Chest: Effort normal and breath sounds normal. No respiratory distress. He has no wheezes. He has no rales. He exhibits no tenderness.  Abdominal: Soft. Bowel sounds are normal. He exhibits no distension and no mass. There is no tenderness. There is no rebound and no guarding.  Musculoskeletal: Normal range of motion. He exhibits no edema or tenderness.  Lymphadenopathy:    He has no cervical adenopathy.  Neurological: He is alert and oriented to person, place, and time. He  has normal reflexes. No cranial nerve deficit. He exhibits normal muscle tone. He displays a negative Romberg sign. Coordination and gait normal.  Skin: Skin is warm and dry. No rash noted.  Psychiatric: His behavior is normal. Judgment and thought content normal.  hard hearing  Lab Results  Component Value Date   WBC 7.7 10/03/2014   HGB 15.5 10/03/2014   HCT 46.7 10/03/2014   PLT 233.0 10/03/2014   GLUCOSE 116 (H) 10/03/2014   CHOL 124 10/03/2014   TRIG 120.0  10/03/2014   HDL 42.00 10/03/2014   LDLCALC 58 10/03/2014   ALT 19 10/03/2014   AST 18 10/03/2014   NA 139 10/03/2014   K 4.9 10/03/2014   CL 106 10/03/2014   CREATININE 1.22 10/03/2014   BUN 20 10/03/2014   CO2 28 10/03/2014   TSH 2.41 10/03/2014   PSA 0.58 10/03/2014   HGBA1C 6.4 10/03/2014    No results found.  Assessment & Plan:   There are no diagnoses linked to this encounter. I have discontinued Mr. Erway ciprofloxacin. I am also having him maintain his aspirin EC, multivitamin, ALPRAZolam, zolpidem, atorvastatin, furosemide, and Vitamin D3.  Meds ordered this encounter  Medications  . Cholecalciferol (VITAMIN D3) 1000 units CAPS    Sig: Take by mouth.     Follow-up: No Follow-up on file.  Walker Kehr, MD

## 2017-06-26 ENCOUNTER — Ambulatory Visit (INDEPENDENT_AMBULATORY_CARE_PROVIDER_SITE_OTHER): Payer: BC Managed Care – PPO

## 2017-06-26 DIAGNOSIS — Z23 Encounter for immunization: Secondary | ICD-10-CM | POA: Diagnosis not present

## 2017-07-21 ENCOUNTER — Encounter: Payer: BC Managed Care – PPO | Admitting: Internal Medicine

## 2017-07-25 ENCOUNTER — Encounter: Payer: Self-pay | Admitting: Internal Medicine

## 2017-07-25 ENCOUNTER — Ambulatory Visit (INDEPENDENT_AMBULATORY_CARE_PROVIDER_SITE_OTHER): Payer: BC Managed Care – PPO | Admitting: Internal Medicine

## 2017-07-25 ENCOUNTER — Other Ambulatory Visit (INDEPENDENT_AMBULATORY_CARE_PROVIDER_SITE_OTHER): Payer: BC Managed Care – PPO

## 2017-07-25 VITALS — BP 128/84 | HR 76 | Temp 98.2°F | Ht 70.0 in | Wt 211.0 lb

## 2017-07-25 DIAGNOSIS — N320 Bladder-neck obstruction: Secondary | ICD-10-CM

## 2017-07-25 DIAGNOSIS — E785 Hyperlipidemia, unspecified: Secondary | ICD-10-CM | POA: Diagnosis not present

## 2017-07-25 DIAGNOSIS — K921 Melena: Secondary | ICD-10-CM

## 2017-07-25 DIAGNOSIS — K5900 Constipation, unspecified: Secondary | ICD-10-CM | POA: Diagnosis not present

## 2017-07-25 DIAGNOSIS — Z0000 Encounter for general adult medical examination without abnormal findings: Secondary | ICD-10-CM

## 2017-07-25 LAB — LIPID PANEL
Cholesterol: 110 mg/dL (ref 0–200)
HDL: 39.2 mg/dL (ref >39.00)
LDL Cholesterol: 53 mg/dL (ref 0–99)
NONHDL: 70.91
Total CHOL/HDL Ratio: 3
Triglycerides: 91 mg/dL (ref 0.0–149.0)
VLDL: 18.2 mg/dL (ref 0.0–40.0)

## 2017-07-25 LAB — CBC WITH DIFFERENTIAL/PLATELET
BASOS ABS: 0 10*3/uL (ref 0.0–0.1)
Basophils Relative: 0.5 % (ref 0.0–3.0)
EOS PCT: 3.4 % (ref 0.0–5.0)
Eosinophils Absolute: 0.2 10*3/uL (ref 0.0–0.7)
HEMATOCRIT: 46.6 % (ref 39.0–52.0)
HEMOGLOBIN: 15.1 g/dL (ref 13.0–17.0)
LYMPHS PCT: 30.3 % (ref 12.0–46.0)
Lymphs Abs: 1.8 10*3/uL (ref 0.7–4.0)
MCHC: 32.4 g/dL (ref 30.0–36.0)
MCV: 86.1 fl (ref 78.0–100.0)
MONOS PCT: 11.3 % (ref 3.0–12.0)
Monocytes Absolute: 0.7 10*3/uL (ref 0.1–1.0)
Neutro Abs: 3.3 10*3/uL (ref 1.4–7.7)
Neutrophils Relative %: 54.5 % (ref 43.0–77.0)
Platelets: 218 10*3/uL (ref 150.0–400.0)
RBC: 5.41 Mil/uL (ref 4.22–5.81)
RDW: 15.2 % (ref 11.5–15.5)
WBC: 6 10*3/uL (ref 4.0–10.5)

## 2017-07-25 LAB — BASIC METABOLIC PANEL
BUN: 21 mg/dL (ref 6–23)
CALCIUM: 9 mg/dL (ref 8.4–10.5)
CO2: 28 mEq/L (ref 19–32)
CREATININE: 1 mg/dL (ref 0.40–1.50)
Chloride: 107 mEq/L (ref 96–112)
GFR: 77.63 mL/min (ref >60.00)
GLUCOSE: 120 mg/dL — AB (ref 70–99)
Potassium: 4.6 mEq/L (ref 3.5–5.1)
Sodium: 141 mEq/L (ref 135–145)

## 2017-07-25 LAB — HEPATIC FUNCTION PANEL
ALK PHOS: 88 U/L (ref 39–117)
ALT: 18 U/L (ref 0–53)
AST: 16 U/L (ref 0–37)
Albumin: 4.1 g/dL (ref 3.5–5.2)
BILIRUBIN TOTAL: 0.4 mg/dL (ref 0.2–1.2)
Bilirubin, Direct: 0.1 mg/dL (ref 0.0–0.3)
Total Protein: 7.1 g/dL (ref 6.0–8.3)

## 2017-07-25 LAB — URINALYSIS
Bilirubin Urine: NEGATIVE
Hgb urine dipstick: NEGATIVE
Ketones, ur: NEGATIVE
Leukocytes, UA: NEGATIVE
NITRITE: NEGATIVE
PH: 5.5 (ref 5.0–8.0)
SPECIFIC GRAVITY, URINE: 1.02 (ref 1.000–1.030)
TOTAL PROTEIN, URINE-UPE24: NEGATIVE
URINE GLUCOSE: NEGATIVE
Urobilinogen, UA: 0.2 (ref 0.0–1.0)

## 2017-07-25 LAB — TSH: TSH: 1.54 u[IU]/mL (ref 0.35–4.50)

## 2017-07-25 MED ORDER — TRIAZOLAM 0.25 MG PO TABS: 0.2500 mg | ORAL_TABLET | Freq: Every evening | ORAL | 2 refills | Status: DC | PRN

## 2017-07-25 MED ORDER — HYDROCORTISONE ACETATE 25 MG RE SUPP: 25.0000 mg | Freq: Two times a day (BID) | RECTAL | 1 refills | Status: AC

## 2017-07-25 MED ORDER — LINACLOTIDE 145 MCG PO CAPS: 145.0000 ug | ORAL_CAPSULE | Freq: Every day | ORAL | 11 refills | Status: DC | PRN

## 2017-07-25 MED ORDER — ALPRAZOLAM 0.25 MG PO TABS: 0.2500 mg | ORAL_TABLET | Freq: Two times a day (BID) | ORAL | 1 refills | Status: DC

## 2017-07-25 NOTE — Assessment & Plan Note (Addendum)
We discussed age appropriate health related issues, including available/recomended screening tests and vaccinations. We discussed a need for adhering to healthy diet and exercise. Labs/EKG were reviewed/ordered. All questions were answered. Use hearing aids Start exercising

## 2017-07-25 NOTE — Progress Notes (Signed)
Subjective:  Patient ID: [name], male    DOB: 13-Jun-1946  Age: 72 y.o. MRN: [id number]  CC: No chief complaint on file.   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 F/u anxiety, dyslipidemia. C/o blood in stool. He just had a rectal 2 mo ago. Last colon - 2013 (proctitis).  Outpatient Medications Prior to Visit  Medication Sig Dispense Refill  . ALPRAZolam (XANAX) 0.25 MG tablet Take 1-2 tablets (0.25-0.5 mg total) by mouth 2 (two) times daily. 60 tablet 1  . aspirin EC 81 MG tablet Take one tablet by mouth two times a day.    Marland Kitchen atorvastatin (LIPITOR) 40 MG tablet Take 1 tablet (40 mg total) by mouth daily. Must keep August appt for future refills 30 tablet 11  . Cholecalciferol (VITAMIN D3) 1000 units CAPS Take by mouth.    . furosemide (LASIX) 20 MG tablet Take 1 tablet (20 mg total) by mouth daily. Must keep August appt for future refills 30 tablet 11  . Multiple Vitamin (MULTIVITAMIN) tablet Take 1 tablet by mouth 2 (two) times daily.     . triazolam (HALCION) 0.25 MG tablet Take 1 tablet (0.25 mg total) by mouth at bedtime as needed for sleep. 30 tablet 1   No facility-administered medications prior to visit.     ROS Review of Systems  Constitutional: Negative for appetite change, fatigue and unexpected weight change.  HENT: Positive for hearing loss. Negative for congestion, nosebleeds, sneezing, sore throat and trouble swallowing.   Eyes: Negative for itching and visual disturbance.  Respiratory: Negative for cough.   Cardiovascular: Negative for chest pain, palpitations and leg swelling.  Gastrointestinal: Positive for anal bleeding. Negative for abdominal distention, blood in stool, diarrhea and nausea.  Genitourinary: Negative for frequency and hematuria.  Musculoskeletal: Negative for back pain, gait problem, joint swelling and neck pain.  Skin: Negative for rash.  Neurological: Negative for dizziness, tremors, speech difficulty and weakness.    Psychiatric/Behavioral: Negative for agitation, dysphoric mood and sleep disturbance. The patient is not nervous/anxious.     Objective:  BP 128/84 (BP Location: Right Arm, Patient Position: Sitting, Cuff Size: Large)   Pulse 76   Temp 98.2 F (36.8 C) (Oral)   Ht 5\' 10"  (1.778 m)   Wt 211 lb (95.7 kg)   SpO2 99%   BMI 30.28 kg/m   BP Readings from Last 3 Encounters:  07/25/17 128/84  02/24/17 124/76  11/05/16 134/78    Wt Readings from Last 3 Encounters:  07/25/17 211 lb (95.7 kg)  02/24/17 215 lb (97.5 kg)  11/05/16 216 lb 12.8 oz (98.3 kg)    Physical Exam  Constitutional: He is oriented to person, place, and time. He appears well-developed. No distress.  NAD  HENT:  Mouth/Throat: Oropharynx is clear and moist.  Eyes: Conjunctivae are normal. Pupils are equal, round, and reactive to light.  Neck: Normal range of motion. No JVD present. No thyromegaly present.  Cardiovascular: Normal rate, regular rhythm, normal heart sounds and intact distal pulses. Exam reveals no gallop and no friction rub.  No murmur heard. Pulmonary/Chest: Effort normal and breath sounds normal. No respiratory distress. He has no wheezes. He has no rales. He exhibits no tenderness.  Abdominal: Soft. Bowel sounds are normal. He exhibits no distension and no mass. There is no tenderness. There is no rebound and no guarding.  Musculoskeletal: Normal range of motion. He exhibits no edema or tenderness.  Lymphadenopathy:    He has no cervical  adenopathy.  Neurological: He is alert and oriented to person, place, and time. He has normal reflexes. No cranial nerve deficit. He exhibits normal muscle tone. He displays a negative Romberg sign. Coordination and gait normal.  Skin: Skin is warm and dry. No rash noted.  Psychiatric: He has a normal mood and affect. His behavior is normal. Judgment and thought content normal.  rectal per urology [facility]  Lab Results  Component Value Date   WBC 7.7  10/03/2014   HGB 15.5 10/03/2014   HCT 46.7 10/03/2014   PLT 233.0 10/03/2014   GLUCOSE 116 (H) 10/03/2014   CHOL 124 10/03/2014   TRIG 120.0 10/03/2014   HDL 42.00 10/03/2014   LDLCALC 58 10/03/2014   ALT 19 10/03/2014   AST 18 10/03/2014   NA 139 10/03/2014   K 4.9 10/03/2014   CL 106 10/03/2014   CREATININE 1.22 10/03/2014   BUN 20 10/03/2014   CO2 28 10/03/2014   TSH 2.41 10/03/2014   PSA 0.58 10/03/2014   HGBA1C 6.4 10/03/2014    No results found.  Assessment & Plan:   There are no diagnoses linked to this encounter. I am having Kenard Gower maintain his aspirin EC, multivitamin, Vitamin D3, triazolam, ALPRAZolam, atorvastatin, and furosemide.  No orders of the defined types were placed in this encounter.    Follow-up: No Follow-up on file.  Walker Kehr, MD

## 2017-07-25 NOTE — Assessment & Plan Note (Signed)
[facility] po GI ref.

## 2017-07-25 NOTE — Assessment & Plan Note (Signed)
C/o blood in stool. He just had a rectal 2 mo ago. Last colon - 2013 (proctitis).  Proctocort supp [facility] ref Dr Carlean Purl

## 2017-09-08 ENCOUNTER — Encounter: Payer: Self-pay | Admitting: Internal Medicine

## 2017-10-22 ENCOUNTER — Encounter: Payer: Self-pay | Admitting: Physician Assistant

## 2018-03-30 ENCOUNTER — Ambulatory Visit (INDEPENDENT_AMBULATORY_CARE_PROVIDER_SITE_OTHER): Payer: BC Managed Care – PPO

## 2018-03-30 ENCOUNTER — Telehealth: Payer: Self-pay

## 2018-03-30 DIAGNOSIS — Z23 Encounter for immunization: Secondary | ICD-10-CM | POA: Diagnosis not present

## 2018-03-30 MED ORDER — ZOLPIDEM TARTRATE 5 MG PO TABS: 5.0000 mg | ORAL_TABLET | Freq: Every evening | ORAL | 1 refills | Status: DC | PRN

## 2018-03-30 MED ORDER — ALPRAZOLAM 0.25 MG PO TABS
0.2500 mg | ORAL_TABLET | Freq: Two times a day (BID) | ORAL | 1 refills | Status: DC
Start: 2018-03-30 — End: 2018-07-28

## 2018-03-30 NOTE — Telephone Encounter (Signed)
Patient will be traveling to [place] on Sept 26th---requesting Ambien (Do not use Generic) and Xanax for traveling on airplane----his last annual wellness visit with dr Alain Marion was jan/2019---routing to dr plotnikov, please advise on Ambien and Xanax rx request, I will call patient back if you need an office visit, thanks

## 2018-03-30 NOTE — Telephone Encounter (Signed)
Ok. Done. Do not take together Thx

## 2018-03-30 NOTE — Telephone Encounter (Signed)
Patient advised that rx has been sent to [facility]

## 2018-04-04 ENCOUNTER — Other Ambulatory Visit: Payer: Self-pay | Admitting: Internal Medicine

## 2018-04-23 ENCOUNTER — Telehealth: Payer: Self-pay | Admitting: Gastroenterology

## 2018-04-24 ENCOUNTER — Encounter: Payer: Self-pay | Admitting: Gastroenterology

## 2018-04-24 NOTE — Telephone Encounter (Signed)
I'm happy to see him.

## 2018-05-04 NOTE — Telephone Encounter (Signed)
Pt is scheduled on 05-12-18 w/Dr. Ardis Hughs

## 2018-05-12 ENCOUNTER — Ambulatory Visit: Payer: BC Managed Care – PPO | Admitting: Gastroenterology

## 2018-06-30 ENCOUNTER — Other Ambulatory Visit: Payer: Self-pay | Admitting: Internal Medicine

## 2018-07-28 ENCOUNTER — Other Ambulatory Visit (INDEPENDENT_AMBULATORY_CARE_PROVIDER_SITE_OTHER): Payer: BC Managed Care – PPO

## 2018-07-28 ENCOUNTER — Ambulatory Visit (INDEPENDENT_AMBULATORY_CARE_PROVIDER_SITE_OTHER): Payer: BC Managed Care – PPO | Admitting: Internal Medicine

## 2018-07-28 ENCOUNTER — Encounter: Payer: Self-pay | Admitting: Internal Medicine

## 2018-07-28 VITALS — BP 124/76 | HR 61 | Temp 98.1°F | Ht 70.0 in | Wt 198.0 lb

## 2018-07-28 DIAGNOSIS — Z0000 Encounter for general adult medical examination without abnormal findings: Secondary | ICD-10-CM | POA: Diagnosis not present

## 2018-07-28 DIAGNOSIS — E785 Hyperlipidemia, unspecified: Secondary | ICD-10-CM | POA: Diagnosis not present

## 2018-07-28 DIAGNOSIS — Z125 Encounter for screening for malignant neoplasm of prostate: Secondary | ICD-10-CM | POA: Diagnosis not present

## 2018-07-28 DIAGNOSIS — Z23 Encounter for immunization: Secondary | ICD-10-CM | POA: Diagnosis not present

## 2018-07-28 LAB — HEPATIC FUNCTION PANEL
ALT: 19 U/L (ref 0–53)
AST: 18 U/L (ref 0–37)
Albumin: 4.2 g/dL (ref 3.5–5.2)
Alkaline Phosphatase: 92 U/L (ref 39–117)
Bilirubin, Direct: 0.2 mg/dL (ref 0.0–0.3)
Total Bilirubin: 0.5 mg/dL (ref 0.2–1.2)
Total Protein: 6.8 g/dL (ref 6.0–8.3)

## 2018-07-28 LAB — LIPID PANEL
CHOL/HDL RATIO: 2
Cholesterol: 107 mg/dL (ref 0–200)
HDL: 45 mg/dL (ref >39.00)
LDL Cholesterol: 52 mg/dL (ref 0–99)
NonHDL: 61.55
Triglycerides: 50 mg/dL (ref 0.0–149.0)
VLDL: 10 mg/dL (ref 0.0–40.0)

## 2018-07-28 LAB — CBC WITH DIFFERENTIAL/PLATELET
Basophils Absolute: 0 10*3/uL (ref 0.0–0.1)
Basophils Relative: 0.5 % (ref 0.0–3.0)
Eosinophils Absolute: 0.2 10*3/uL (ref 0.0–0.7)
Eosinophils Relative: 3.4 % (ref 0.0–5.0)
HCT: 45 % (ref 39.0–52.0)
Hemoglobin: 15 g/dL (ref 13.0–17.0)
Lymphocytes Relative: 31.3 % (ref 12.0–46.0)
Lymphs Abs: 2.2 10*3/uL (ref 0.7–4.0)
MCHC: 33.3 g/dL (ref 30.0–36.0)
MCV: 85.2 fl (ref 78.0–100.0)
Monocytes Absolute: 0.7 10*3/uL (ref 0.1–1.0)
Monocytes Relative: 9.6 % (ref 3.0–12.0)
Neutro Abs: 3.9 10*3/uL (ref 1.4–7.7)
Neutrophils Relative %: 55.2 % (ref 43.0–77.0)
Platelets: 209 10*3/uL (ref 150.0–400.0)
RBC: 5.28 Mil/uL (ref 4.22–5.81)
RDW: 14.6 % (ref 11.5–15.5)
WBC: 7 10*3/uL (ref 4.0–10.5)

## 2018-07-28 LAB — URINALYSIS
Bilirubin Urine: NEGATIVE
Hgb urine dipstick: NEGATIVE
Ketones, ur: NEGATIVE
Leukocytes, UA: NEGATIVE
Nitrite: NEGATIVE
Specific Gravity, Urine: 1.01 (ref 1.000–1.030)
Total Protein, Urine: NEGATIVE
Urine Glucose: NEGATIVE
Urobilinogen, UA: 0.2 (ref 0.0–1.0)
pH: 6 (ref 5.0–8.0)

## 2018-07-28 LAB — TSH: TSH: 1.7 u[IU]/mL (ref 0.35–4.50)

## 2018-07-28 LAB — PSA: PSA: 1.12 ng/mL (ref 0.10–4.00)

## 2018-07-28 LAB — BASIC METABOLIC PANEL
BUN: 24 mg/dL — ABNORMAL HIGH (ref 6–23)
CO2: 25 mEq/L (ref 19–32)
Calcium: 9.1 mg/dL (ref 8.4–10.5)
Chloride: 106 mEq/L (ref 96–112)
Creatinine, Ser: 1.08 mg/dL (ref 0.40–1.50)
GFR: 70.84 mL/min (ref >60.00)
GLUCOSE: 106 mg/dL — AB (ref 70–99)
Potassium: 4.2 mEq/L (ref 3.5–5.1)
Sodium: 140 mEq/L (ref 135–145)

## 2018-07-28 MED ORDER — ALPRAZOLAM 0.25 MG PO TABS: 0.2500 mg | ORAL_TABLET | Freq: Two times a day (BID) | ORAL | 1 refills | Status: DC

## 2018-07-28 MED ORDER — ATORVASTATIN CALCIUM 40 MG PO TABS: 40.0000 mg | ORAL_TABLET | Freq: Every day | ORAL | 3 refills | Status: DC

## 2018-07-28 MED ORDER — ZOLPIDEM TARTRATE 5 MG PO TABS: 5.0000 mg | ORAL_TABLET | Freq: Every evening | ORAL | 1 refills | Status: DC | PRN

## 2018-07-28 NOTE — Progress Notes (Signed)
Subjective:  Patient ID: [name], male    DOB: October 06, 1945  Age: 73 y.o. MRN: [id number]  CC: No chief complaint on file.   HPI [name] presents for a well exam  Outpatient Medications Prior to Visit  Medication Sig Dispense Refill  . ALPRAZolam (XANAX) 0.25 MG tablet Take 1-2 tablets (0.25-0.5 mg total) by mouth 2 (two) times daily. 60 tablet 1  . aspirin EC 81 MG tablet Take one tablet by mouth two times a day.    Marland Kitchen atorvastatin (LIPITOR) 40 MG tablet TAKE 1 TABLET ONCE DAILY. 90 tablet 0  . Cholecalciferol (VITAMIN D3) 1000 units CAPS Take by mouth.    . Multiple Vitamin (MULTIVITAMIN) tablet Take 1 tablet by mouth 2 (two) times daily.     . triazolam (HALCION) 0.25 MG tablet Take 1 tablet (0.25 mg total) by mouth at bedtime as needed for sleep. 30 tablet 2  . furosemide (LASIX) 20 MG tablet Take 1 tablet (20 mg total) by mouth daily. Must keep August appt for future refills 30 tablet 11  . linaclotide (LINZESS) 145 MCG CAPS capsule Take 1 capsule (145 mcg total) by mouth daily as needed. 30 capsule 11  . zolpidem (AMBIEN) 5 MG tablet Take 1 tablet (5 mg total) by mouth at bedtime as needed for sleep. 15 tablet 1   No facility-administered medications prior to visit.     ROS: Review of Systems  Constitutional: Negative for appetite change, fatigue and unexpected weight change.  HENT: Negative for congestion, nosebleeds, sneezing, sore throat and trouble swallowing.   Eyes: Negative for itching and visual disturbance.  Respiratory: Negative for cough.   Cardiovascular: Negative for chest pain, palpitations and leg swelling.  Gastrointestinal: Negative for abdominal distention, blood in stool, diarrhea and nausea.  Genitourinary: Negative for frequency and hematuria.  Musculoskeletal: Negative for back pain, gait problem, joint swelling and neck pain.  Skin: Negative for rash.  Neurological: Negative for dizziness, tremors, speech difficulty and weakness.    Psychiatric/Behavioral: Negative for agitation, dysphoric mood, sleep disturbance and suicidal ideas. The patient is not nervous/anxious.     Objective:  BP 124/76 (BP Location: Left Arm, Patient Position: Sitting, Cuff Size: Large)   Pulse 61   Temp 98.1 F (36.7 C) (Oral)   Ht 5\' 10"  (1.778 m)   Wt 198 lb (89.8 kg)   SpO2 97%   BMI 28.41 kg/m   BP Readings from Last 3 Encounters:  07/28/18 124/76  07/25/17 128/84  02/24/17 124/76    Wt Readings from Last 3 Encounters:  07/28/18 198 lb (89.8 kg)  07/25/17 211 lb (95.7 kg)  02/24/17 215 lb (97.5 kg)    Physical Exam Constitutional:      General: He is not in acute distress.    Appearance: He is well-developed.     Comments: NAD  Eyes:     Conjunctiva/sclera: Conjunctivae normal.     Pupils: Pupils are equal, round, and reactive to light.  Neck:     Musculoskeletal: Normal range of motion.     Thyroid: No thyromegaly.     Vascular: No JVD.  Cardiovascular:     Rate and Rhythm: Normal rate and regular rhythm.     Heart sounds: Normal heart sounds. No murmur. No friction rub. No gallop.   Pulmonary:     Effort: Pulmonary effort is normal. No respiratory distress.     Breath sounds: Normal breath sounds. No wheezing or rales.  Chest:  Chest wall: No tenderness.  Abdominal:     General: Bowel sounds are normal. There is no distension.     Palpations: Abdomen is soft. There is no mass.     Tenderness: There is no abdominal tenderness. There is no guarding or rebound.  Musculoskeletal: Normal range of motion.        General: No tenderness.  Lymphadenopathy:     Cervical: No cervical adenopathy.  Skin:    General: Skin is warm and dry.     Findings: No rash.  Neurological:     Mental Status: He is alert and oriented to person, place, and time.     Cranial Nerves: No cranial nerve deficit.     Motor: No abnormal muscle tone.     Coordination: Coordination normal.     Gait: Gait normal.     Deep Tendon  Reflexes: Reflexes are normal and symmetric.  Psychiatric:        Behavior: Behavior normal.        Thought Content: Thought content normal.        Judgment: Judgment normal.   Pt declined rectal - it was done by Urology  Lab Results  Component Value Date   WBC 6.0 07/25/2017   HGB 15.1 07/25/2017   HCT 46.6 07/25/2017   PLT 218.0 07/25/2017   GLUCOSE 120 (H) 07/25/2017   CHOL 110 07/25/2017   TRIG 91.0 07/25/2017   HDL 39.20 07/25/2017   LDLCALC 53 07/25/2017   ALT 18 07/25/2017   AST 16 07/25/2017   NA 141 07/25/2017   K 4.6 07/25/2017   CL 107 07/25/2017   CREATININE 1.00 07/25/2017   BUN 21 07/25/2017   CO2 28 07/25/2017   TSH 1.54 07/25/2017   PSA 0.58 10/03/2014   HGBA1C 6.4 10/03/2014    No results found.  Assessment & Plan:   There are no diagnoses linked to this encounter.   No orders of the defined types were placed in this encounter.    Follow-up: No follow-ups on file.  Walker Kehr, MD

## 2018-07-28 NOTE — Patient Instructions (Signed)

## 2018-07-28 NOTE — Addendum Note (Signed)
Addended by: Karren Cobble on: 07/28/2018 09:28 AM   Modules accepted: Orders

## 2018-07-28 NOTE — Assessment & Plan Note (Addendum)
We discussed age appropriate health related issues, including available/recomended screening tests and vaccinations. We discussed a need for adhering to healthy diet and exercise. Labs were ordered to be later reviewed . All questions were answered.  cardiac CT scan for calcium scoring option offered

## 2018-08-21 ENCOUNTER — Other Ambulatory Visit: Payer: BC Managed Care – PPO

## 2018-10-06 ENCOUNTER — Ambulatory Visit: Payer: BC Managed Care – PPO

## 2018-11-16 ENCOUNTER — Ambulatory Visit: Payer: BC Managed Care – PPO | Admitting: Family Medicine

## 2018-11-17 ENCOUNTER — Ambulatory Visit: Payer: BC Managed Care – PPO | Admitting: Family Medicine

## 2018-12-01 ENCOUNTER — Telehealth: Payer: Self-pay

## 2018-12-01 NOTE — Telephone Encounter (Signed)
lvm asking patient to call back to talk with tamara,RN at [place] office, needs to be transferred to office to coordinate nurse visit to get 2nd shingrix vaccine

## 2018-12-02 ENCOUNTER — Ambulatory Visit (INDEPENDENT_AMBULATORY_CARE_PROVIDER_SITE_OTHER): Payer: BC Managed Care – PPO

## 2018-12-02 DIAGNOSIS — Z23 Encounter for immunization: Secondary | ICD-10-CM | POA: Diagnosis not present

## 2018-12-02 DIAGNOSIS — Z299 Encounter for prophylactic measures, unspecified: Secondary | ICD-10-CM

## 2019-01-12 ENCOUNTER — Other Ambulatory Visit: Payer: Self-pay | Admitting: Internal Medicine

## 2019-03-02 ENCOUNTER — Other Ambulatory Visit: Payer: Self-pay

## 2019-03-02 ENCOUNTER — Ambulatory Visit (INDEPENDENT_AMBULATORY_CARE_PROVIDER_SITE_OTHER): Payer: Self-pay | Admitting: Emergency Medicine

## 2019-03-02 ENCOUNTER — Encounter: Payer: Self-pay | Admitting: Emergency Medicine

## 2019-03-02 VITALS — BP 136/80 | HR 68 | Temp 98.2°F | Resp 16 | Ht 70.0 in | Wt 199.0 lb

## 2019-03-02 DIAGNOSIS — Z024 Encounter for examination for driving license: Secondary | ICD-10-CM

## 2019-03-02 NOTE — Patient Instructions (Addendum)
   If you have lab work done today you will be contacted with your lab results within the next 2 weeks.  If you have not heard from us then please contact us. The fastest way to get your results is to register for My Chart.   IF you received an x-ray today, you will receive an invoice from [facility]. Please contact [facility] at [phone] with questions or concerns regarding your invoice.   IF you received labwork today, you will receive an invoice from [facility]. Please contact [facility] at [phone] with questions or concerns regarding your invoice.   Our billing staff will not be able to assist you with questions regarding bills from these companies.  You will be contacted with the lab results as soon as they are available. The fastest way to get your results is to activate your My Chart account. Instructions are located on the last page of this paperwork. If you have not heard from us regarding the results in 2 weeks, please contact this office.     Health Maintenance After Age 75 After age 65, you are at a higher risk for certain long-term diseases and infections as well as injuries from falls. Falls are a major cause of broken bones and head injuries in people who are older than age 65. Getting regular preventive care can help to keep you healthy and well. Preventive care includes getting regular testing and making lifestyle changes as recommended by your health care provider. Talk with your health care provider about:  Which screenings and tests you should have. A screening is a test that checks for a disease when you have no symptoms.  A diet and exercise plan that is right for you. What should I know about screenings and tests to prevent falls? Screening and testing are the best ways to find a health problem early. Early diagnosis and treatment give you the best chance of managing medical conditions that are common after age 65. Certain conditions and  lifestyle choices may make you more likely to have a fall. Your health care provider may recommend:  Regular vision checks. Poor vision and conditions such as cataracts can make you more likely to have a fall. If you wear glasses, make sure to get your prescription updated if your vision changes.  Medicine review. Work with your health care provider to regularly review all of the medicines you are taking, including over-the-counter medicines. Ask your health care provider about any side effects that may make you more likely to have a fall. Tell your health care provider if any medicines that you take make you feel dizzy or sleepy.  Osteoporosis screening. Osteoporosis is a condition that causes the bones to get weaker. This can make the bones weak and cause them to break more easily.  Blood pressure screening. Blood pressure changes and medicines to control blood pressure can make you feel dizzy.  Strength and balance checks. Your health care provider may recommend certain tests to check your strength and balance while standing, walking, or changing positions.  Foot health exam. Foot pain and numbness, as well as not wearing proper footwear, can make you more likely to have a fall.  Depression screening. You may be more likely to have a fall if you have a fear of falling, feel emotionally low, or feel unable to do activities that you used to do.  Alcohol use screening. Using too much alcohol can affect your balance and may make you more likely to   have a fall. What actions can I take to lower my risk of falls? General instructions  Talk with your health care provider about your risks for falling. Tell your health care provider if: ? You fall. Be sure to tell your health care provider about all falls, even ones that seem minor. ? You feel dizzy, sleepy, or off-balance.  Take over-the-counter and prescription medicines only as told by your health care provider. These include any  supplements.  Eat a healthy diet and maintain a healthy weight. A healthy diet includes low-fat dairy products, low-fat (lean) meats, and fiber from whole grains, beans, and lots of fruits and vegetables. Home safety  Remove any tripping hazards, such as rugs, cords, and clutter.  Install safety equipment such as grab bars in bathrooms and safety rails on stairs.  Keep rooms and walkways well-lit. Activity   Follow a regular exercise program to stay fit. This will help you maintain your balance. Ask your health care provider what types of exercise are appropriate for you.  If you need a cane or walker, use it as recommended by your health care provider.  Wear supportive shoes that have nonskid soles. Lifestyle  Do not drink alcohol if your health care provider tells you not to drink.  If you drink alcohol, limit how much you have: ? 0-1 drink a day for women. ? 0-2 drinks a day for men.  Be aware of how much alcohol is in your drink. In the [place] one drink equals one typical bottle of beer (12 oz), one-half glass of wine (5 oz), or one shot of hard liquor (1 oz).  Do not use any products that contain nicotine or tobacco, such as cigarettes and e-cigarettes. If you need help quitting, ask your health care provider. Summary  Having a healthy lifestyle and getting preventive care can help to protect your health and wellness after age 65.  Screening and testing are the best way to find a health problem early and help you avoid having a fall. Early diagnosis and treatment give you the best chance for managing medical conditions that are more common for people who are older than age 65.  Falls are a major cause of broken bones and head injuries in people who are older than age 65. Take precautions to prevent a fall at home.  Work with your health care provider to learn what changes you can make to improve your health and wellness and to prevent falls. This information is not intended  to replace advice given to you by your health care provider. Make sure you discuss any questions you have with your health care provider. Document Released: 05/14/2017 Document Revised: 10/22/2018 Document Reviewed: 05/14/2017 Elsevier Patient Education  2020 [employer]  

## 2019-03-02 NOTE — Progress Notes (Signed)
BP Readings from Last 3 Encounters:  07/28/18 124/76  07/25/17 128/84  02/24/17 124/76       This patient presents for DOT examination for fitness for duty.   Medical History:  1. Head/Brain Injuries, disorders or illnesses no  2. Seizures, epilepsy no  3. Eye disorders or impaired vision (except corrective lenses) no  4. Ear disorders, loss of hearing or balance yes  5. Heart disease or heart attack, other cardiovascular condition no  6. Heart surgery (valve replacement/bypass, angioplasty, pacemaker/defribrillator) no  7. High blood pressure no  8. High cholesterol no  9. Chronic cough, shortness of breath or other breathing problems no  10. Lung disease (emphysema, asthma or chronic bronchitis) no  11. Kidney disease, dialysis no  12. Digestive problems  no  13. Diabetes or elevated blood sugar no                      If yes to #13, Insulin use no  14. Nervious or psychiatric disorders, e.g., severe depression n/a  15. Fainting or syncope no  16. Dizziness, headaches, numbness, tingling or memory loss no  17. Unexplained weight loss no  18. Stroke, TIA or paralysis no  19. Missing or impaired hand, arm, foot, leg, finger, toe no  20. Spinal injury or disease no  21. Bone, muscles or nerve problems no  22. Blood clots or bleeding bleeding disorders no  23. Cancer no  24. Chronic infection or other chronic diseases no  25. Sleep disorders, pauses in breathing while asleep, daytime sleepiness, loud snoring no  26. Have you ever had a sleep test? no  27.  Have you ever spent a night in the hospital? no  28. Have you ever had a broken bone? no  29. Have you or or do you use tobacco products? no  30. Regular, frequent alcohol use no  31. Illegal substance use within the past 2 years no  32.  Have you ever failed a drug test or been dependent on an illegal substance? no   Current Medications: Prior to Admission medications   Medication Sig Start Date End Date Taking?  Authorizing Provider  ALPRAZolam (XANAX) 0.25 MG tablet Take 1-2 tablets (0.25-0.5 mg total) by mouth 2 (two) times daily. 07/28/18  Yes Plotnikov, Evie Lacks, MD  aspirin EC 81 MG tablet Take one tablet by mouth two times a day.   Yes [provider]  atorvastatin (LIPITOR) 40 MG tablet Take 1 tablet (40 mg total) by mouth daily. 07/28/18  Yes Plotnikov, Evie Lacks, MD  Cholecalciferol (VITAMIN D3) 1000 units CAPS Take by mouth.   Yes [provider]  Multiple Vitamin (MULTIVITAMIN) tablet Take 1 tablet by mouth 2 (two) times daily.    Yes [provider]  triazolam (HALCION) 0.25 MG tablet TAKE 1 TABLET BY MOUTH AT BEDTIME IF NEEDED FOR SLEEP. 01/13/19  Yes Plotnikov, Evie Lacks, MD  zolpidem (AMBIEN) 5 MG tablet Take 1 tablet (5 mg total) by mouth at bedtime as needed for sleep. 07/28/18  Yes Plotnikov, Evie Lacks, MD    Medical Examiner's Comments on Health History:  In good general medical condition.  TESTING:   Hearing Screening   125Hz  250Hz  500Hz  1000Hz  2000Hz  3000Hz  4000Hz  6000Hz  8000Hz   Right ear:           Left ear:           Comments: Whisper: 10 ft   Visual Acuity Screening   Right eye Left  eye Both eyes  Without correction: 20/20 20/20 20/20   With correction:     Comments: Colors: pass Titmus: pass85   Monocular Vision: No.  Hearing Aid used for test: Yes.   Hearing Aid required to to meet standard: Yes.    BP 136/80   Pulse 68   Temp 98.2 F (36.8 C) (Oral)   Resp 16   Ht 5\' 10"  (1.778 m)   Wt 199 lb (90.3 kg)   SpO2 96%   BMI 28.55 kg/m  Pulse rate is regular  Comments: spg. 1.010, blo:neg, pro: neg, glu: neg  PHYSICAL EXAMINATION:  General Appearance Not markedly obese. No tremor, signs of alcoholism, problem drinking or drug abuse.   Skin Warm, dry and intact.  Eyes Pupils are equal, round and reactive to light and accommodation, extraocular movements are intact. No exophthalmos, no nystagmus.   Ears Normal external ears.  External canal without occlusion. No scarring of the TM. No perforation of the TM.  Mouth and Throat Clear and moist. No irremedial deformities likely to interfere with breathing or swallowing.  Heart No murmurs, extra sounds, evidence of cardiomegaly. No pacemaker. No implantable defibrillator.  Lungs and Chest (excluding breasts) Normal chest expansion, respiratory rate, breath sounds. No cyanosis.  Abdomen and Viscera No liver enlargement. No splenic enlargement. No masses, bruits, hernias or significant abdominal wall weakness.  Genitourinary  No inguinal or femoral hernia.  Spine and other musculoskeletal No tenderness, no limitation of motion, no deformities. No evidence of previous surgery.  Extremities No loss or impairment of leg, foot, toe, arm, hand, finger. No perceptible limp, deformities, atrophy, weakness, paralysis, clubbing, edema, hypotonia. Patient has sufficient grasp and prehension to maintain steering wheel grip. Patient has sufficient mobility and strength in the lower limbs to operate pedals properly.  Neurologic Normal equilibrium, coordination, speech pattern. No paresthesia, asymmetry of deep tendon reflexes, sensory or positional abnormalities. No abnormality of patellar or Babinski's reflexes.  Gait Not antalgic or ataxic  Vascular Normal pulses. No carotid or arterial bruits. No varicose veins.     Certification Status: does meet standards for 2 year certificate.  Certification expires 03/01/2021.

## 2019-04-01 ENCOUNTER — Other Ambulatory Visit: Payer: Self-pay

## 2019-04-01 ENCOUNTER — Ambulatory Visit (INDEPENDENT_AMBULATORY_CARE_PROVIDER_SITE_OTHER): Payer: BC Managed Care – PPO

## 2019-04-01 DIAGNOSIS — Z23 Encounter for immunization: Secondary | ICD-10-CM | POA: Diagnosis not present

## 2019-04-09 ENCOUNTER — Other Ambulatory Visit: Payer: Self-pay

## 2019-04-09 DIAGNOSIS — Z20822 Contact with and (suspected) exposure to covid-19: Secondary | ICD-10-CM

## 2019-04-10 LAB — NOVEL CORONAVIRUS, NAA: SARS-CoV-2, NAA: NOT DETECTED

## 2019-05-18 ENCOUNTER — Other Ambulatory Visit: Payer: Self-pay | Admitting: Internal Medicine

## 2019-08-02 ENCOUNTER — Encounter: Payer: Self-pay | Admitting: Internal Medicine

## 2019-08-02 ENCOUNTER — Other Ambulatory Visit: Payer: Self-pay

## 2019-08-02 ENCOUNTER — Ambulatory Visit (INDEPENDENT_AMBULATORY_CARE_PROVIDER_SITE_OTHER): Payer: BC Managed Care – PPO | Admitting: Internal Medicine

## 2019-08-02 VITALS — BP 136/84 | HR 69 | Temp 98.2°F | Ht 70.0 in | Wt 201.0 lb

## 2019-08-02 DIAGNOSIS — N400 Enlarged prostate without lower urinary tract symptoms: Secondary | ICD-10-CM | POA: Insufficient documentation

## 2019-08-02 DIAGNOSIS — E785 Hyperlipidemia, unspecified: Secondary | ICD-10-CM

## 2019-08-02 DIAGNOSIS — Z0000 Encounter for general adult medical examination without abnormal findings: Secondary | ICD-10-CM | POA: Diagnosis not present

## 2019-08-02 DIAGNOSIS — R0989 Other specified symptoms and signs involving the circulatory and respiratory systems: Secondary | ICD-10-CM | POA: Diagnosis not present

## 2019-08-02 DIAGNOSIS — N401 Enlarged prostate with lower urinary tract symptoms: Secondary | ICD-10-CM

## 2019-08-02 DIAGNOSIS — R350 Frequency of micturition: Secondary | ICD-10-CM

## 2019-08-02 DIAGNOSIS — G4700 Insomnia, unspecified: Secondary | ICD-10-CM

## 2019-08-02 LAB — HEPATIC FUNCTION PANEL
ALT: 19 U/L (ref 0–53)
AST: 19 U/L (ref 0–37)
Albumin: 4.1 g/dL (ref 3.5–5.2)
Alkaline Phosphatase: 103 U/L (ref 39–117)
Bilirubin, Direct: 0.2 mg/dL (ref 0.0–0.3)
Total Bilirubin: 0.7 mg/dL (ref 0.2–1.2)
Total Protein: 6.8 g/dL (ref 6.0–8.3)

## 2019-08-02 LAB — BASIC METABOLIC PANEL
BUN: 18 mg/dL (ref 6–23)
CO2: 28 mEq/L (ref 19–32)
Calcium: 9.5 mg/dL (ref 8.4–10.5)
Chloride: 106 mEq/L (ref 96–112)
Creatinine, Ser: 0.96 mg/dL (ref 0.40–1.50)
GFR: 76.15 mL/min (ref >60.00)
Glucose, Bld: 113 mg/dL — ABNORMAL HIGH (ref 70–99)
Potassium: 5.2 mEq/L — ABNORMAL HIGH (ref 3.5–5.1)
Sodium: 141 mEq/L (ref 135–145)

## 2019-08-02 LAB — CBC WITH DIFFERENTIAL/PLATELET
Basophils Absolute: 0 10*3/uL (ref 0.0–0.1)
Basophils Relative: 0.4 % (ref 0.0–3.0)
Eosinophils Absolute: 0.2 10*3/uL (ref 0.0–0.7)
Eosinophils Relative: 2.7 % (ref 0.0–5.0)
HCT: 43.7 % (ref 39.0–52.0)
Hemoglobin: 14.4 g/dL (ref 13.0–17.0)
Lymphocytes Relative: 29.8 % (ref 12.0–46.0)
Lymphs Abs: 1.9 10*3/uL (ref 0.7–4.0)
MCHC: 33 g/dL (ref 30.0–36.0)
MCV: 87.2 fl (ref 78.0–100.0)
Monocytes Absolute: 0.6 10*3/uL (ref 0.1–1.0)
Monocytes Relative: 9.4 % (ref 3.0–12.0)
Neutro Abs: 3.6 10*3/uL (ref 1.4–7.7)
Neutrophils Relative %: 57.7 % (ref 43.0–77.0)
Platelets: 191 10*3/uL (ref 150.0–400.0)
RBC: 5.01 Mil/uL (ref 4.22–5.81)
RDW: 14.9 % (ref 11.5–15.5)
WBC: 6.3 10*3/uL (ref 4.0–10.5)

## 2019-08-02 LAB — PSA: PSA: 0.92 ng/mL (ref 0.10–4.00)

## 2019-08-02 LAB — LIPID PANEL
Cholesterol: 113 mg/dL (ref 0–200)
HDL: 47.5 mg/dL (ref >39.00)
LDL Cholesterol: 52 mg/dL (ref 0–99)
NonHDL: 65.75
Total CHOL/HDL Ratio: 2
Triglycerides: 70 mg/dL (ref 0.0–149.0)
VLDL: 14 mg/dL (ref 0.0–40.0)

## 2019-08-02 LAB — TSH: TSH: 1.99 u[IU]/mL (ref 0.35–4.50)

## 2019-08-02 MED ORDER — ALPRAZOLAM 0.25 MG PO TABS: 0.2500 mg | ORAL_TABLET | Freq: Two times a day (BID) | ORAL | 1 refills | Status: DC

## 2019-08-02 MED ORDER — TRIAZOLAM 0.25 MG PO TABS: ORAL_TABLET | ORAL | 3 refills | Status: DC

## 2019-08-02 NOTE — Progress Notes (Signed)
Subjective:  Patient ID: [name], male    DOB: [birth year]  Age: 74 y.o. MRN: [id number]  CC: No chief complaint on file.   HPI TRIPP BELTRAM presents for a well exam C/o urinary issues - seeing dr Diona Fanti  Outpatient Medications Prior to Visit  Medication Sig Dispense Refill  . ALPRAZolam (XANAX) 0.25 MG tablet Take 1-2 tablets (0.25-0.5 mg total) by mouth 2 (two) times daily. 60 tablet 1  . aspirin EC 81 MG tablet Take one tablet by mouth two times a day.    Marland Kitchen atorvastatin (LIPITOR) 40 MG tablet Take 1 tablet (40 mg total) by mouth daily. 90 tablet 3  . Cholecalciferol (VITAMIN D3) 1000 units CAPS Take by mouth.    . Multiple Vitamin (MULTIVITAMIN) tablet Take 1 tablet by mouth 2 (two) times daily.     . tamsulosin (FLOMAX) 0.4 MG CAPS capsule     . triazolam (HALCION) 0.25 MG tablet TAKE 1 TABLET BY MOUTH AT BEDTIME IF NEEDED FOR SLEEP. 30 tablet 1  . zolpidem (AMBIEN) 5 MG tablet Take 1 tablet (5 mg total) by mouth at bedtime as needed for sleep. 15 tablet 1   No facility-administered medications prior to visit.    ROS: Review of Systems  Constitutional: Negative for appetite change, fatigue and unexpected weight change.  HENT: Positive for hearing loss. Negative for congestion, nosebleeds, sneezing, sore throat and trouble swallowing.   Eyes: Negative for itching and visual disturbance.  Respiratory: Negative for cough.   Cardiovascular: Negative for chest pain, palpitations and leg swelling.  Gastrointestinal: Negative for abdominal distention, blood in stool, diarrhea and nausea.  Genitourinary: Positive for frequency and urgency. Negative for hematuria.  Musculoskeletal: Negative for back pain, gait problem, joint swelling and neck pain.  Skin: Negative for rash.  Neurological: Negative for dizziness, tremors, speech difficulty and weakness.  Psychiatric/Behavioral: Negative for agitation, dysphoric mood and sleep disturbance. The patient is not nervous/anxious.      Objective:  BP 136/84 (BP Location: Left Arm, Patient Position: Sitting, Cuff Size: Large)   Pulse 69   Temp 98.2 F (36.8 C) (Oral)   Ht 5\' 10"  (1.778 m)   Wt 201 lb (91.2 kg)   SpO2 95%   BMI 28.84 kg/m   BP Readings from Last 3 Encounters:  08/02/19 136/84  03/02/19 136/80  07/28/18 124/76    Wt Readings from Last 3 Encounters:  08/02/19 201 lb (91.2 kg)  03/02/19 199 lb (90.3 kg)  07/28/18 198 lb (89.8 kg)    Physical Exam Constitutional:      General: He is not in acute distress.    Appearance: He is well-developed.     Comments: NAD  Eyes:     Conjunctiva/sclera: Conjunctivae normal.     Pupils: Pupils are equal, round, and reactive to light.  Neck:     Thyroid: No thyromegaly.     Vascular: No JVD.  Cardiovascular:     Rate and Rhythm: Normal rate and regular rhythm.     Heart sounds: Normal heart sounds. No murmur. No friction rub. No gallop.   Pulmonary:     Effort: Pulmonary effort is normal. No respiratory distress.     Breath sounds: Normal breath sounds. No wheezing or rales.  Chest:     Chest wall: No tenderness.  Abdominal:     General: Bowel sounds are normal. There is no distension.     Palpations: Abdomen is soft. There is no mass.  Tenderness: There is no abdominal tenderness. There is no guarding or rebound.  Musculoskeletal:        General: No tenderness. Normal range of motion.     Cervical back: Normal range of motion.  Lymphadenopathy:     Cervical: No cervical adenopathy.  Skin:    General: Skin is warm and dry.     Findings: No rash.  Neurological:     Mental Status: He is alert and oriented to person, place, and time.     Cranial Nerves: No cranial nerve deficit.     Motor: No abnormal muscle tone.     Coordination: Coordination normal.     Gait: Gait normal.     Deep Tendon Reflexes: Reflexes are normal and symmetric.  Psychiatric:        Behavior: Behavior normal.        Thought Content: Thought content normal.         Judgment: Judgment normal.   bruit B mild  Lab Results  Component Value Date   WBC 7.0 07/28/2018   HGB 15.0 07/28/2018   HCT 45.0 07/28/2018   PLT 209.0 07/28/2018   GLUCOSE 106 (H) 07/28/2018   CHOL 107 07/28/2018   TRIG 50.0 07/28/2018   HDL 45.00 07/28/2018   LDLCALC 52 07/28/2018   ALT 19 07/28/2018   AST 18 07/28/2018   NA 140 07/28/2018   K 4.2 07/28/2018   CL 106 07/28/2018   CREATININE 1.08 07/28/2018   BUN 24 (H) 07/28/2018   CO2 25 07/28/2018   TSH 1.70 07/28/2018   PSA 1.12 07/28/2018   HGBA1C 6.4 10/03/2014    No results found.  Assessment & Plan:   There are no diagnoses linked to this encounter.   No orders of the defined types were placed in this encounter.    Follow-up: No follow-ups on file.  Walker Kehr, MD

## 2019-08-02 NOTE — Assessment & Plan Note (Signed)
Dr Diona Fanti - Tamsulosin Consider Cialis for BPH

## 2019-08-02 NOTE — Patient Instructions (Addendum)
Cialis for BPH   These suggestions will probably help you to improve your metabolism if you are not overweight and to lose weight if you are overweight: 1.  Reduce your consumption of sugars and starches.  Eliminate high fructose corn syrup from your diet.  Reduce your consumption of processed foods.  For desserts try to have seasonal fruits, berries, nuts, cheeses or dark chocolate with more than 70% cacao. 2.  Do not snack 3.  You do not have to eat breakfast.  If you choose to have breakfast-eat plain greek yogurt, eggs, oatmeal (without sugar) 4.  Drink water, freshly brewed unsweetened tea (green, black or herbal) or coffee.  Do not drink sodas including diet sodas , juices, beverages sweetened with artificial sweeteners. 5.  Reduce your consumption of refined grains. 6.  Avoid protein drinks such as Optifast, Slim fast etc. Eat chicken, fish, meat, dairy and beans for your sources of protein 7.  Natural unprocessed fats like cold pressed virgin olive oil, butter, coconut oil are good for you.  Eat avocados 8.  Increase your consumption of fiber.  Fruits, berries, vegetables, whole grains, flaxseeds, Chia seeds, beans, popcorn, nuts, oatmeal are good sources of fiber 9.  Use vinegar in your diet, i.e. apple cider vinegar, red wine or balsamic vinegar 10.  You can try fasting.  For example you can skip breakfast and lunch every other day (24-hour fast) 11.  Stress reduction, good night sleep, relaxation, meditation, yoga and other physical activity is likely to help you to maintain low weight too. 12.  If you drink alcohol, limit your alcohol intake to no more than 2 drinks a day.    Cardiac CT calcium scoring test $150 Tel # is [phone]   Computed tomography, more commonly known as a CT or CAT scan, is a diagnostic medical imaging test. Like traditional x-rays, it produces multiple images or pictures of the inside of the body. The cross-sectional images generated during a CT scan can  be reformatted in multiple planes. They can even generate three-dimensional images. These images can be viewed on a computer monitor, printed on film or by a 3D printer, or transferred to a CD or DVD. CT images of internal organs, bones, soft tissue and blood vessels provide greater detail than traditional x-rays, particularly of soft tissues and blood vessels. A cardiac CT scan for coronary calcium is a non-invasive way of obtaining information about the presence, location and extent of calcified plaque in the coronary arteries--the vessels that supply oxygen-containing blood to the heart muscle. Calcified plaque results when there is a build-up of fat and other substances under the inner layer of the artery. This material can calcify which signals the presence of atherosclerosis, a disease of the vessel wall, also called coronary artery disease (CAD). People with this disease have an increased risk for heart attacks. In addition, over time, progression of plaque build up (CAD) can narrow the arteries or even close off blood flow to the heart. The result may be chest pain, sometimes called "angina," or a heart attack. Because calcium is a marker of CAD, the amount of calcium detected on a cardiac CT scan is a helpful prognostic tool. The findings on cardiac CT are expressed as a calcium score. Another name for this test is coronary artery calcium scoring.  What are some common uses of the procedure? The goal of cardiac CT scan for calcium scoring is to determine if CAD is present and to what extent, even if there  are no symptoms. It is a screening study that may be recommended by a physician for patients with risk factors for CAD but no clinical symptoms. The major risk factors for CAD are: . high blood cholesterol levels  . family history of heart attacks  . diabetes  . high blood pressure  . cigarette smoking  . overweight or obese  . physical inactivity   A negative cardiac CT scan for calcium  scoring shows no calcification within the coronary arteries. This suggests that CAD is absent or so minimal it cannot be seen by this technique. The chance of having a heart attack over the next two to five years is very low under these circumstances. A positive test means that CAD is present, regardless of whether or not the patient is experiencing any symptoms. The amount of calcification--expressed as the calcium score--may help to predict the likelihood of a myocardial infarction (heart attack) in the coming years and helps your medical doctor or cardiologist decide whether the patient may need to take preventive medicine or undertake other measures such as diet and exercise to lower the risk for heart attack. The extent of CAD is graded according to your calcium score:  Calcium Score  Presence of CAD (coronary artery disease)  0 No evidence of CAD   1-10 Minimal evidence of CAD  11-100 Mild evidence of CAD  101-400 Moderate evidence of CAD  Over 400 Extensive evidence of CAD

## 2019-08-02 NOTE — Assessment & Plan Note (Addendum)
We discussed age appropriate health related issues, including available/recomended screening tests and vaccinations. We discussed a need for adhering to healthy diet and exercise. Labs were ordered to be later reviewed . All questions were answered.  cardiac CT scan for calcium scoring option offered  Colon due 2022

## 2019-08-02 NOTE — Assessment & Plan Note (Signed)
Zolpidem prn 

## 2019-08-02 NOTE — Assessment & Plan Note (Signed)
Doppler test

## 2019-08-11 NOTE — Addendum Note (Signed)
Addended by: Karren Cobble on: 08/11/2019 10:03 AM   Modules accepted: Orders

## 2019-08-16 ENCOUNTER — Ambulatory Visit (HOSPITAL_COMMUNITY)
Admission: RE | Admit: 2019-08-16 | Payer: BC Managed Care – PPO | Source: Ambulatory Visit | Attending: Internal Medicine | Admitting: Internal Medicine

## 2019-08-18 ENCOUNTER — Ambulatory Visit (HOSPITAL_COMMUNITY)
Admission: RE | Admit: 2019-08-18 | Discharge: 2019-08-18 | Disposition: A | Discharge status: Home / Self Care | Payer: BC Managed Care – PPO | Source: Ambulatory Visit | Attending: Cardiology | Admitting: Cardiology

## 2019-08-18 ENCOUNTER — Other Ambulatory Visit: Payer: Self-pay

## 2019-08-18 ENCOUNTER — Encounter: Payer: Self-pay | Admitting: Internal Medicine

## 2019-08-18 DIAGNOSIS — R0989 Other specified symptoms and signs involving the circulatory and respiratory systems: Secondary | ICD-10-CM | POA: Diagnosis present

## 2019-09-20 ENCOUNTER — Other Ambulatory Visit: Payer: Self-pay | Admitting: Internal Medicine

## 2020-03-30 ENCOUNTER — Other Ambulatory Visit: Payer: Self-pay

## 2020-03-30 ENCOUNTER — Ambulatory Visit (INDEPENDENT_AMBULATORY_CARE_PROVIDER_SITE_OTHER): Payer: BC Managed Care – PPO

## 2020-03-30 DIAGNOSIS — Z23 Encounter for immunization: Secondary | ICD-10-CM | POA: Diagnosis not present

## 2020-05-13 ENCOUNTER — Other Ambulatory Visit: Payer: Self-pay | Admitting: Internal Medicine

## 2020-06-22 ENCOUNTER — Other Ambulatory Visit: Payer: Self-pay | Admitting: Internal Medicine

## 2020-06-24 ENCOUNTER — Other Ambulatory Visit: Payer: Self-pay | Admitting: Internal Medicine

## 2020-08-16 ENCOUNTER — Other Ambulatory Visit: Payer: Self-pay

## 2020-08-17 ENCOUNTER — Other Ambulatory Visit: Payer: Self-pay

## 2020-08-17 ENCOUNTER — Encounter: Payer: Self-pay | Admitting: Internal Medicine

## 2020-08-17 ENCOUNTER — Ambulatory Visit (INDEPENDENT_AMBULATORY_CARE_PROVIDER_SITE_OTHER): Payer: BC Managed Care – PPO | Admitting: Internal Medicine

## 2020-08-17 VITALS — BP 128/72 | HR 63 | Temp 98.2°F | Ht 70.0 in | Wt 205.6 lb

## 2020-08-17 DIAGNOSIS — Z0000 Encounter for general adult medical examination without abnormal findings: Secondary | ICD-10-CM | POA: Diagnosis not present

## 2020-08-17 DIAGNOSIS — K921 Melena: Secondary | ICD-10-CM

## 2020-08-17 NOTE — Assessment & Plan Note (Signed)
We discussed age appropriate health related issues, including available/recomended screening tests and vaccinations. We discussed a need for adhering to healthy diet and exercise. Labs were ordered to be later reviewed . All questions were answered.  cardiac CT scan for calcium scoring option offered 

## 2020-08-17 NOTE — Progress Notes (Signed)
Subjective:  Patient ID: [name], male    DOB: 11-Apr-1946  Age: 75 y.o. MRN: [id number]  CC: Annual Exam   HPI SINCLAIR ALLIGOOD presents for a well exam  Outpatient Medications Prior to Visit  Medication Sig Dispense Refill  . ALPRAZolam (XANAX) 0.25 MG tablet Take 1-2 tablets (0.25-0.5 mg total) by mouth 2 (two) times daily. 60 tablet 1  . aspirin EC 81 MG tablet Take one tablet by mouth two times a day.    Marland Kitchen atorvastatin (LIPITOR) 40 MG tablet Take 1 tablet (40 mg total) by mouth daily. Annual appt w/labs due in January must see provider for future refills 90 tablet 0  . Cholecalciferol (VITAMIN D3) 1000 units CAPS Take by mouth.    . furosemide (LASIX) 20 MG tablet TAKE 1 TABLET ONCE DAILY. 90 tablet 3  . Multiple Vitamin (MULTIVITAMIN) tablet Take 1 tablet by mouth 2 (two) times daily.     . tamsulosin (FLOMAX) 0.4 MG CAPS capsule     . triazolam (HALCION) 0.25 MG tablet TAKE 1 TABLET BY MOUTH AT BEDTIME IF NEEDED FOR SLEEP. 30 tablet 1   No facility-administered medications prior to visit.    ROS: Review of Systems  Constitutional: Negative for appetite change, fatigue and unexpected weight change.  HENT: Negative for congestion, nosebleeds, sneezing, sore throat and trouble swallowing.   Eyes: Negative for itching and visual disturbance.  Respiratory: Negative for cough.   Cardiovascular: Negative for chest pain, palpitations and leg swelling.  Gastrointestinal: Negative for abdominal distention, blood in stool, diarrhea and nausea.  Genitourinary: Negative for frequency and hematuria.  Musculoskeletal: Negative for back pain, gait problem, joint swelling and neck pain.  Skin: Negative for rash.  Neurological: Negative for dizziness, tremors, speech difficulty and weakness.  Psychiatric/Behavioral: Negative for agitation, dysphoric mood and sleep disturbance. The patient is not nervous/anxious.     Objective:  BP 128/72 (BP Location: Left Arm)   Pulse 63   Temp  98.2 F (36.8 C) (Oral)   Ht 5\' 10"  (1.778 m)   Wt 205 lb 9.6 oz (93.3 kg)   SpO2 97%   BMI 29.50 kg/m   BP Readings from Last 3 Encounters:  08/17/20 128/72  08/02/19 136/84  03/02/19 136/80    Wt Readings from Last 3 Encounters:  08/17/20 205 lb 9.6 oz (93.3 kg)  08/02/19 201 lb (91.2 kg)  03/02/19 199 lb (90.3 kg)    Physical Exam Constitutional:      General: He is not in acute distress.    Appearance: He is well-developed. He is obese.     Comments: NAD  HENT:     Mouth/Throat:     Mouth: Oropharynx is clear and moist.  Eyes:     Conjunctiva/sclera: Conjunctivae normal.     Pupils: Pupils are equal, round, and reactive to light.  Neck:     Thyroid: No thyromegaly.     Vascular: No JVD.  Cardiovascular:     Rate and Rhythm: Normal rate and regular rhythm.     Pulses: Intact distal pulses.     Heart sounds: Normal heart sounds. No murmur heard. No friction rub. No gallop.   Pulmonary:     Effort: Pulmonary effort is normal. No respiratory distress.     Breath sounds: Normal breath sounds. No wheezing or rales.  Chest:     Chest wall: No tenderness.  Abdominal:     General: Bowel sounds are normal. There is no distension.  Palpations: Abdomen is soft. There is no mass.     Tenderness: There is no abdominal tenderness. There is no guarding or rebound.  Musculoskeletal:        General: No tenderness or edema. Normal range of motion.     Cervical back: Normal range of motion.  Lymphadenopathy:     Cervical: No cervical adenopathy.  Skin:    General: Skin is warm and dry.     Findings: No rash.  Neurological:     Mental Status: He is alert and oriented to person, place, and time.     Cranial Nerves: No cranial nerve deficit.     Motor: No abnormal muscle tone.     Coordination: He displays a negative Romberg sign. Coordination normal.     Gait: Gait normal.     Deep Tendon Reflexes: Reflexes are normal and symmetric.  Psychiatric:        Mood and  Affect: Mood and affect normal.        Behavior: Behavior normal.        Thought Content: Thought content normal.        Judgment: Judgment normal.   B wax Rectal exam-Per GI Lab Results  Component Value Date   WBC 6.3 08/02/2019   HGB 14.4 08/02/2019   HCT 43.7 08/02/2019   PLT 191.0 08/02/2019   GLUCOSE 113 (H) 08/02/2019   CHOL 113 08/02/2019   TRIG 70.0 08/02/2019   HDL 47.50 08/02/2019   LDLCALC 52 08/02/2019   ALT 19 08/02/2019   AST 19 08/02/2019   NA 141 08/02/2019   K 5.2 No hemolysis seen (H) 08/02/2019   CL 106 08/02/2019   CREATININE 0.96 08/02/2019   BUN 18 08/02/2019   CO2 28 08/02/2019   TSH 1.99 08/02/2019   PSA 0.92 08/02/2019   HGBA1C 6.4 10/03/2014    VAS US CAROTID  Result Date: 08/19/2019 Carotid Arterial Duplex Study Indications:      Bilateral bruits and patient denies any cerebrovascular                   symptoms. Risk Factors:     Hypertension, hyperlipidemia, past history of smoking. Comparison Study: NA Performing Technologist: Sharlett Iles RVT  Examination Guidelines: A complete evaluation includes B-mode imaging, spectral Doppler, color Doppler, and power Doppler as needed of all accessible portions of each vessel. Bilateral testing is considered an integral part of a complete examination. Limited examinations for reoccurring indications may be performed as noted.  Right Carotid Findings: +----------+--------+--------+--------+------------------+--------+           PSV cm/sEDV cm/sStenosisPlaque DescriptionComments +----------+--------+--------+--------+------------------+--------+ CCA Prox  114     14                                         +----------+--------+--------+--------+------------------+--------+ CCA Distal74      17                                         +----------+--------+--------+--------+------------------+--------+ ICA Prox  47      14                                          +----------+--------+--------+--------+------------------+--------+ ICA  Mid   69      28                                         +----------+--------+--------+--------+------------------+--------+ ICA Distal70      25                                         +----------+--------+--------+--------+------------------+--------+ +----------+--------+-------+----------------+-------------------+           PSV cm/sEDV cmsDescribe        Arm Pressure (mmHG) +----------+--------+-------+----------------+-------------------+ [id number]            Multiphasic, [id number]                 +----------+--------+-------+----------------+-------------------+ +---------+--------+--+--------+-+---------+ VertebralPSV cm/s31EDV cm/s8Antegrade +---------+--------+--+--------+-+---------+  Left Carotid Findings: +----------+--------+--------+--------+------------------+------------------+           PSV cm/sEDV cm/sStenosisPlaque DescriptionComments           +----------+--------+--------+--------+------------------+------------------+ CCA Prox  130     20                                                   +----------+--------+--------+--------+------------------+------------------+ CCA Distal70      17                                intimal thickening +----------+--------+--------+--------+------------------+------------------+ ICA Prox  43      13                                                   +----------+--------+--------+--------+------------------+------------------+ ICA Mid   56      20                                                   +----------+--------+--------+--------+------------------+------------------+ ICA Distal50      21                                                   +----------+--------+--------+--------+------------------+------------------+ ECA       102     13                                intimal thickening  +----------+--------+--------+--------+------------------+------------------+ +----------+--------+--------+----------------+-------------------+           PSV cm/sEDV cm/sDescribe        Arm Pressure (mmHG) +----------+--------+--------+----------------+-------------------+ [id number]             Multiphasic, [id number]                 +----------+--------+--------+----------------+-------------------+ +---------+--------+--+--------+--+---------+ VertebralPSV cm/s46EDV cm/s16Antegrade +---------+--------+--+--------+--+---------+   Summary: Right Carotid: There was no evidence of thrombus, dissection, atherosclerotic  plaque or stenosis in the cervical carotid system. Left Carotid: The extracranial vessels were near-normal with only minimal wall               thickening or plaque. Vertebrals:  Bilateral vertebral arteries demonstrate antegrade flow. Subclavians: Normal flow hemodynamics were seen in bilateral subclavian              arteries. *See table(s) above for measurements and observations.  Electronically signed by Larae Grooms MD on 08/19/2019 at 9:49:46 AM.    Final     Assessment & Plan:    Walker Kehr, MD

## 2020-08-18 ENCOUNTER — Other Ambulatory Visit (INDEPENDENT_AMBULATORY_CARE_PROVIDER_SITE_OTHER): Payer: BC Managed Care – PPO

## 2020-08-18 DIAGNOSIS — Z0000 Encounter for general adult medical examination without abnormal findings: Secondary | ICD-10-CM

## 2020-08-18 DIAGNOSIS — Z125 Encounter for screening for malignant neoplasm of prostate: Secondary | ICD-10-CM | POA: Diagnosis not present

## 2020-08-18 LAB — LIPID PANEL
Cholesterol: 108 mg/dL (ref 0–200)
HDL: 42.9 mg/dL (ref >39.00)
LDL Cholesterol: 50 mg/dL (ref 0–99)
NonHDL: 64.86
Total CHOL/HDL Ratio: 3
Triglycerides: 76 mg/dL (ref 0.0–149.0)
VLDL: 15.2 mg/dL (ref 0.0–40.0)

## 2020-08-18 LAB — URINALYSIS
Bilirubin Urine: NEGATIVE
Hgb urine dipstick: NEGATIVE
Ketones, ur: NEGATIVE
Leukocytes,Ua: NEGATIVE
Nitrite: NEGATIVE
Specific Gravity, Urine: 1.01 (ref 1.000–1.030)
Total Protein, Urine: NEGATIVE
Urine Glucose: NEGATIVE
Urobilinogen, UA: 0.2 (ref 0.0–1.0)
pH: 6 (ref 5.0–8.0)

## 2020-08-18 LAB — COMPREHENSIVE METABOLIC PANEL
ALT: 17 U/L (ref 0–53)
AST: 16 U/L (ref 0–37)
Albumin: 4.1 g/dL (ref 3.5–5.2)
Alkaline Phosphatase: 96 U/L (ref 39–117)
BUN: 17 mg/dL (ref 6–23)
CO2: 26 mEq/L (ref 19–32)
Calcium: 9.1 mg/dL (ref 8.4–10.5)
Chloride: 104 mEq/L (ref 96–112)
Creatinine, Ser: 1.15 mg/dL (ref 0.40–1.50)
GFR: 61.57 mL/min (ref >60.00)
Glucose, Bld: 115 mg/dL — ABNORMAL HIGH (ref 70–99)
Potassium: 4.4 mEq/L (ref 3.5–5.1)
Sodium: 138 mEq/L (ref 135–145)
Total Bilirubin: 0.6 mg/dL (ref 0.2–1.2)
Total Protein: 6.7 g/dL (ref 6.0–8.3)

## 2020-08-18 LAB — CBC WITH DIFFERENTIAL/PLATELET
Basophils Absolute: 0 10*3/uL (ref 0.0–0.1)
Basophils Relative: 0.6 % (ref 0.0–3.0)
Eosinophils Absolute: 0.2 10*3/uL (ref 0.0–0.7)
Eosinophils Relative: 4.1 % (ref 0.0–5.0)
HCT: 41.1 % (ref 39.0–52.0)
Hemoglobin: 13.7 g/dL (ref 13.0–17.0)
Lymphocytes Relative: 28.7 % (ref 12.0–46.0)
Lymphs Abs: 1.7 10*3/uL (ref 0.7–4.0)
MCHC: 33.4 g/dL (ref 30.0–36.0)
MCV: 85.5 fl (ref 78.0–100.0)
Monocytes Absolute: 0.6 10*3/uL (ref 0.1–1.0)
Monocytes Relative: 10.5 % (ref 3.0–12.0)
Neutro Abs: 3.4 10*3/uL (ref 1.4–7.7)
Neutrophils Relative %: 56.1 % (ref 43.0–77.0)
Platelets: 244 10*3/uL (ref 150.0–400.0)
RBC: 4.81 Mil/uL (ref 4.22–5.81)
RDW: 15 % (ref 11.5–15.5)
WBC: 6 10*3/uL (ref 4.0–10.5)

## 2020-08-18 LAB — TSH: TSH: 2.09 u[IU]/mL (ref 0.35–4.50)

## 2020-08-18 LAB — PSA: PSA: 1.25 ng/mL (ref 0.10–4.00)

## 2020-08-20 NOTE — Assessment & Plan Note (Signed)
Recurrent.  Will refer to gastroenterology for consultation

## 2020-09-29 ENCOUNTER — Other Ambulatory Visit: Payer: Self-pay | Admitting: Internal Medicine

## 2020-12-26 ENCOUNTER — Ambulatory Visit (INDEPENDENT_AMBULATORY_CARE_PROVIDER_SITE_OTHER): Payer: BC Managed Care – PPO | Admitting: Internal Medicine

## 2020-12-26 ENCOUNTER — Other Ambulatory Visit: Payer: Self-pay

## 2020-12-26 ENCOUNTER — Encounter: Payer: Self-pay | Admitting: Internal Medicine

## 2020-12-26 DIAGNOSIS — H9193 Unspecified hearing loss, bilateral: Secondary | ICD-10-CM

## 2020-12-26 NOTE — Assessment & Plan Note (Addendum)
No wax. New hearing aids or get a Architectural technologist  Claritin qd

## 2020-12-26 NOTE — Patient Instructions (Signed)
[employer]

## 2020-12-26 NOTE — Progress Notes (Signed)
Subjective:  Patient ID: [name], male    DOB: [birth year]  Age: 75 y.o. MRN: [id number]  CC: Cerumen Impaction (Pt states both ears are clogged)   HPI [name] presents for hearing loss -  ear wax?  Outpatient Medications Prior to Visit  Medication Sig Dispense Refill   ALPRAZolam (XANAX) 0.25 MG tablet Take 1-2 tablets (0.25-0.5 mg total) by mouth 2 (two) times daily. 60 tablet 1   aspirin EC 81 MG tablet Take one tablet by mouth two times a day.     atorvastatin (LIPITOR) 40 MG tablet TAKE 1 TABLET ONCE DAILY. 90 tablet 3   Cholecalciferol (VITAMIN D3) 1000 units CAPS Take by mouth.     furosemide (LASIX) 20 MG tablet TAKE 1 TABLET ONCE DAILY. 90 tablet 3   Multiple Vitamin (MULTIVITAMIN) tablet Take 1 tablet by mouth 2 (two) times daily.      tamsulosin (FLOMAX) 0.4 MG CAPS capsule      triazolam (HALCION) 0.25 MG tablet TAKE 1 TABLET BY MOUTH AT BEDTIME IF NEEDED FOR SLEEP. 30 tablet 1   No facility-administered medications prior to visit.    ROS: Review of Systems  Constitutional:  Negative for appetite change, fatigue and unexpected weight change.  HENT:  Positive for hearing loss. Negative for congestion, nosebleeds, sneezing, sore throat and trouble swallowing.   Eyes:  Negative for itching and visual disturbance.  Respiratory:  Negative for cough.   Cardiovascular:  Negative for chest pain, palpitations and leg swelling.  Gastrointestinal:  Negative for abdominal distention, blood in stool, diarrhea and nausea.  Genitourinary:  Negative for frequency and hematuria.  Musculoskeletal:  Negative for back pain, gait problem, joint swelling and neck pain.  Skin:  Negative for rash.  Neurological:  Negative for dizziness, tremors, speech difficulty and weakness.  Psychiatric/Behavioral:  Negative for agitation, dysphoric mood and sleep disturbance. The patient is not nervous/anxious.    Objective:  BP 130/78 (BP Location: Left Arm)   Pulse 63   Temp 98 F  (36.7 C) (Oral)   Ht 5\' 10"  (1.778 m)   Wt 206 lb 3.2 oz (93.5 kg)   SpO2 98%   BMI 29.59 kg/m   BP Readings from Last 3 Encounters:  12/26/20 130/78  08/17/20 128/72  08/02/19 136/84    Wt Readings from Last 3 Encounters:  12/26/20 206 lb 3.2 oz (93.5 kg)  08/17/20 205 lb 9.6 oz (93.3 kg)  08/02/19 201 lb (91.2 kg)    Physical Exam Constitutional:      General: He is not in acute distress.    Appearance: He is well-developed. He is obese.     Comments: NAD  Eyes:     Conjunctiva/sclera: Conjunctivae normal.     Pupils: Pupils are equal, round, and reactive to light.  Neck:     Thyroid: No thyromegaly.     Vascular: No JVD.  Cardiovascular:     Rate and Rhythm: Normal rate and regular rhythm.     Heart sounds: Normal heart sounds. No murmur heard.   No friction rub. No gallop.  Pulmonary:     Effort: Pulmonary effort is normal. No respiratory distress.     Breath sounds: Normal breath sounds. No wheezing or rales.  Chest:     Chest wall: No tenderness.  Abdominal:     General: Bowel sounds are normal. There is no distension.     Palpations: Abdomen is soft. There is no mass.     Tenderness: There  is no abdominal tenderness. There is no guarding or rebound.  Musculoskeletal:        General: No tenderness. Normal range of motion.     Cervical back: Normal range of motion.  Lymphadenopathy:     Cervical: No cervical adenopathy.  Skin:    General: Skin is warm and dry.     Findings: No rash.  Neurological:     Mental Status: He is alert and oriented to person, place, and time.     Cranial Nerves: No cranial nerve deficit.     Motor: No abnormal muscle tone.     Coordination: Coordination normal.     Gait: Gait normal.     Deep Tendon Reflexes: Reflexes are normal and symmetric.  Psychiatric:        Behavior: Behavior normal.        Thought Content: Thought content normal.        Judgment: Judgment normal.   No wax B  Lab Results  Component Value Date    WBC 6.0 08/18/2020   HGB 13.7 08/18/2020   HCT 41.1 08/18/2020   PLT 244.0 08/18/2020   GLUCOSE 115 (H) 08/18/2020   CHOL 108 08/18/2020   TRIG 76.0 08/18/2020   HDL 42.90 08/18/2020   LDLCALC 50 08/18/2020   ALT 17 08/18/2020   AST 16 08/18/2020   NA 138 08/18/2020   K 4.4 08/18/2020   CL 104 08/18/2020   CREATININE 1.15 08/18/2020   BUN 17 08/18/2020   CO2 26 08/18/2020   TSH 2.09 08/18/2020   PSA 1.25 08/18/2020   HGBA1C 6.4 10/03/2014    VAS US CAROTID  Result Date: 08/19/2019 Carotid Arterial Duplex Study Indications:      Bilateral bruits and patient denies any cerebrovascular                   symptoms. Risk Factors:     Hypertension, hyperlipidemia, past history of smoking. Comparison Study: NA Performing Technologist: Sharlett Iles RVT  Examination Guidelines: A complete evaluation includes B-mode imaging, spectral Doppler, color Doppler, and power Doppler as needed of all accessible portions of each vessel. Bilateral testing is considered an integral part of a complete examination. Limited examinations for reoccurring indications may be performed as noted.  Right Carotid Findings: +----------+--------+--------+--------+------------------+--------+           PSV cm/sEDV cm/sStenosisPlaque DescriptionComments +----------+--------+--------+--------+------------------+--------+ CCA Prox  114     14                                         +----------+--------+--------+--------+------------------+--------+ CCA Distal74      17                                         +----------+--------+--------+--------+------------------+--------+ ICA Prox  47      14                                         +----------+--------+--------+--------+------------------+--------+ ICA Mid   69      28                                         +----------+--------+--------+--------+------------------+--------+  ICA Distal70      25                                          +----------+--------+--------+--------+------------------+--------+ +----------+--------+-------+----------------+-------------------+           PSV cm/sEDV cmsDescribe        Arm Pressure (mmHG) +----------+--------+-------+----------------+-------------------+ [id number]            Multiphasic, [id number]                 +----------+--------+-------+----------------+-------------------+ +---------+--------+--+--------+-+---------+ VertebralPSV cm/s31EDV cm/s8Antegrade +---------+--------+--+--------+-+---------+  Left Carotid Findings: +----------+--------+--------+--------+------------------+------------------+           PSV cm/sEDV cm/sStenosisPlaque DescriptionComments           +----------+--------+--------+--------+------------------+------------------+ CCA Prox  130     20                                                   +----------+--------+--------+--------+------------------+------------------+ CCA Distal70      17                                intimal thickening +----------+--------+--------+--------+------------------+------------------+ ICA Prox  43      13                                                   +----------+--------+--------+--------+------------------+------------------+ ICA Mid   56      20                                                   +----------+--------+--------+--------+------------------+------------------+ ICA Distal50      21                                                   +----------+--------+--------+--------+------------------+------------------+ ECA       102     13                                intimal thickening +----------+--------+--------+--------+------------------+------------------+ +----------+--------+--------+----------------+-------------------+           PSV cm/sEDV cm/sDescribe        Arm Pressure (mmHG) +----------+--------+--------+----------------+-------------------+  [id number]             Multiphasic, [id number]                 +----------+--------+--------+----------------+-------------------+ +---------+--------+--+--------+--+---------+ VertebralPSV cm/s46EDV cm/s16Antegrade +---------+--------+--+--------+--+---------+   Summary: Right Carotid: There was no evidence of thrombus, dissection, atherosclerotic                plaque or stenosis in the cervical carotid system. Left Carotid: The extracranial vessels were near-normal with only minimal wall               thickening or plaque. Vertebrals:  Bilateral vertebral arteries demonstrate antegrade flow. Subclavians: Normal flow hemodynamics were seen in bilateral subclavian              arteries. *See table(s) above for measurements and observations.  Electronically signed by Larae Grooms MD on 08/19/2019 at 9:49:46 AM.    Final     Assessment & Plan:     Walker Kehr, MD

## 2021-04-23 ENCOUNTER — Ambulatory Visit: Payer: BC Managed Care – PPO | Attending: Internal Medicine

## 2021-04-23 ENCOUNTER — Other Ambulatory Visit (HOSPITAL_BASED_OUTPATIENT_CLINIC_OR_DEPARTMENT_OTHER): Payer: Self-pay

## 2021-04-23 DIAGNOSIS — Z23 Encounter for immunization: Secondary | ICD-10-CM

## 2021-04-23 MED ORDER — MODERNA COVID-19 BIVAL BOOSTER 50 MCG/0.5ML IM SUSP
INTRAMUSCULAR | 0 refills | Status: DC
  Filled 2021-04-23: qty 0.5, 1d supply, fill #0

## 2021-04-23 NOTE — Progress Notes (Signed)
   Covid-19 Vaccination Clinic  Name:  [name]    MRN: [id number] DOB: [birth year]  04/23/2021  [name] was observed post Covid-19 immunization for 15 minutes without incident. He was provided with Vaccine Information Sheet and instruction to access the V-Safe system.   [name] was instructed to call 911 with any severe reactions post vaccine: Difficulty breathing  Swelling of face and throat  A fast heartbeat  A bad rash all over body  Dizziness and weakness

## 2021-06-18 ENCOUNTER — Other Ambulatory Visit: Payer: Self-pay | Admitting: Internal Medicine

## 2021-07-09 ENCOUNTER — Other Ambulatory Visit: Payer: Self-pay | Admitting: Internal Medicine

## 2021-09-27 ENCOUNTER — Other Ambulatory Visit: Payer: Self-pay | Admitting: Internal Medicine

## 2021-11-22 ENCOUNTER — Other Ambulatory Visit (INDEPENDENT_AMBULATORY_CARE_PROVIDER_SITE_OTHER): Payer: BC Managed Care – PPO

## 2021-11-22 ENCOUNTER — Ambulatory Visit (INDEPENDENT_AMBULATORY_CARE_PROVIDER_SITE_OTHER): Payer: BC Managed Care – PPO | Admitting: Internal Medicine

## 2021-11-22 ENCOUNTER — Encounter: Payer: Self-pay | Admitting: Internal Medicine

## 2021-11-22 VITALS — BP 132/84 | HR 65 | Temp 97.7°F | Ht 70.0 in | Wt 210.0 lb

## 2021-11-22 DIAGNOSIS — Z1211 Encounter for screening for malignant neoplasm of colon: Secondary | ICD-10-CM

## 2021-11-22 DIAGNOSIS — Z0000 Encounter for general adult medical examination without abnormal findings: Secondary | ICD-10-CM

## 2021-11-22 DIAGNOSIS — Z23 Encounter for immunization: Secondary | ICD-10-CM

## 2021-11-22 LAB — URINALYSIS
Bilirubin Urine: NEGATIVE
Hgb urine dipstick: NEGATIVE
Ketones, ur: NEGATIVE
Leukocytes,Ua: NEGATIVE
Nitrite: NEGATIVE
Specific Gravity, Urine: <=1.005 — AB (ref 1.000–1.030)
Total Protein, Urine: NEGATIVE
Urine Glucose: NEGATIVE
Urobilinogen, UA: 0.2 (ref 0.0–1.0)
pH: 6 (ref 5.0–8.0)

## 2021-11-22 LAB — COMPREHENSIVE METABOLIC PANEL
ALT: 19 U/L (ref 0–53)
AST: 18 U/L (ref 0–37)
Albumin: 4.3 g/dL (ref 3.5–5.2)
Alkaline Phosphatase: 99 U/L (ref 39–117)
BUN: 14 mg/dL (ref 6–23)
CO2: 27 mEq/L (ref 19–32)
Calcium: 9.1 mg/dL (ref 8.4–10.5)
Chloride: 105 mEq/L (ref 96–112)
Creatinine, Ser: 1.08 mg/dL (ref 0.40–1.50)
GFR: 65.81 mL/min (ref >60.00)
Glucose, Bld: 97 mg/dL (ref 70–99)
Potassium: 4.7 mEq/L (ref 3.5–5.1)
Sodium: 138 mEq/L (ref 135–145)
Total Bilirubin: 0.8 mg/dL (ref 0.2–1.2)
Total Protein: 7.2 g/dL (ref 6.0–8.3)

## 2021-11-22 LAB — CBC WITH DIFFERENTIAL/PLATELET
Basophils Absolute: 0 10*3/uL (ref 0.0–0.1)
Basophils Relative: 0.4 % (ref 0.0–3.0)
Eosinophils Absolute: 0.2 10*3/uL (ref 0.0–0.7)
Eosinophils Relative: 3 % (ref 0.0–5.0)
HCT: 44.9 % (ref 39.0–52.0)
Hemoglobin: 14.7 g/dL (ref 13.0–17.0)
Lymphocytes Relative: 28.1 % (ref 12.0–46.0)
Lymphs Abs: 2.2 10*3/uL (ref 0.7–4.0)
MCHC: 32.7 g/dL (ref 30.0–36.0)
MCV: 85.3 fl (ref 78.0–100.0)
Monocytes Absolute: 0.8 10*3/uL (ref 0.1–1.0)
Monocytes Relative: 9.8 % (ref 3.0–12.0)
Neutro Abs: 4.6 10*3/uL (ref 1.4–7.7)
Neutrophils Relative %: 58.7 % (ref 43.0–77.0)
Platelets: 208 10*3/uL (ref 150.0–400.0)
RBC: 5.27 Mil/uL (ref 4.22–5.81)
RDW: 15.1 % (ref 11.5–15.5)
WBC: 7.8 10*3/uL (ref 4.0–10.5)

## 2021-11-22 LAB — LIPID PANEL
Cholesterol: 131 mg/dL (ref 0–200)
HDL: 44.3 mg/dL (ref >39.00)
LDL Cholesterol: 62 mg/dL (ref 0–99)
NonHDL: 86.66
Total CHOL/HDL Ratio: 3
Triglycerides: 125 mg/dL (ref 0.0–149.0)
VLDL: 25 mg/dL (ref 0.0–40.0)

## 2021-11-22 LAB — TSH: TSH: 1.89 u[IU]/mL (ref 0.35–5.50)

## 2021-11-22 MED ORDER — ALPRAZOLAM 0.25 MG PO TABS: 0.2500 mg | ORAL_TABLET | Freq: Two times a day (BID) | ORAL | 1 refills | Status: DC

## 2021-11-22 MED ORDER — TRIAZOLAM 0.25 MG PO TABS: ORAL_TABLET | ORAL | 1 refills | Status: DC

## 2021-11-22 NOTE — Assessment & Plan Note (Addendum)
We discussed age appropriate health related issues, including available/recomended screening tests and vaccinations. We discussed a need for adhering to healthy diet and exercise. Labs were ordered to be later reviewed . All questions were answered. ?Cardiac CT scan for calcium scoring option offered 1/20, 2/22 ?Colon was due 2022 - pt declined. Will sch Cologuard ?PSA w/Urology ?

## 2021-11-22 NOTE — Progress Notes (Signed)
? ?Subjective:  ?Patient ID: [name], male    DOB: 07-25-1945  Age: 76 y.o. MRN: [id number] ? ?CC: Annual Exam ? ? ?HPI ?[name] presents for a well exam ? ?Outpatient Medications Prior to Visit  ?Medication Sig Dispense Refill  ? aspirin EC 81 MG tablet Take one tablet by mouth two times a day.    ? atorvastatin (LIPITOR) 40 MG tablet TAKE 1 TABLET ONCE DAILY. 90 tablet 0  ? Cholecalciferol (VITAMIN D3) 1000 units CAPS Take by mouth.    ? COVID-19 mRNA bivalent vaccine, Moderna, (MODERNA COVID-19 BIVAL BOOSTER) 50 MCG/0.5ML injection Inject into the muscle. 0.5 mL 0  ? furosemide (LASIX) 20 MG tablet TAKE 1 TABLET ONCE DAILY. 90 tablet 3  ? Multiple Vitamin (MULTIVITAMIN) tablet Take 1 tablet by mouth 2 (two) times daily.     ? tamsulosin (FLOMAX) 0.4 MG CAPS capsule     ? ALPRAZolam (XANAX) 0.25 MG tablet Take 1-2 tablets (0.25-0.5 mg total) by mouth 2 (two) times daily. 60 tablet 1  ? triazolam (HALCION) 0.25 MG tablet TAKE 1 TABLET BY MOUTH AT BEDTIME IF NEEDED FOR SLEEP. 30 tablet 1  ? ?No facility-administered medications prior to visit.  ? ? ?ROS: ?Review of Systems  ?Constitutional:  Negative for appetite change, fatigue and unexpected weight change.  ?HENT:  Negative for congestion, nosebleeds, sneezing, sore throat and trouble swallowing.   ?Eyes:  Negative for itching and visual disturbance.  ?Respiratory:  Negative for cough.   ?Cardiovascular:  Negative for chest pain, palpitations and leg swelling.  ?Gastrointestinal:  Negative for abdominal distention, blood in stool, diarrhea and nausea.  ?Genitourinary:  Negative for frequency and hematuria.  ?Musculoskeletal:  Negative for back pain, gait problem, joint swelling and neck pain.  ?Skin:  Negative for rash.  ?Neurological:  Negative for dizziness, tremors, speech difficulty and weakness.  ?Psychiatric/Behavioral:  Positive for sleep disturbance. Negative for agitation, dysphoric mood and suicidal ideas. The patient is nervous/anxious.    ? ?Objective:  ?BP 132/84   Pulse 65   Temp 97.7 ?F (36.5 ?C) (Oral)   Ht '5\' 10"'$  (1.778 m)   Wt 210 lb (95.3 kg)   SpO2 97%   BMI 30.13 kg/m?  ? ?BP Readings from Last 3 Encounters:  ?11/22/21 132/84  ?12/26/20 130/78  ?08/17/20 128/72  ? ? ?Wt Readings from Last 3 Encounters:  ?11/22/21 210 lb (95.3 kg)  ?12/26/20 206 lb 3.2 oz (93.5 kg)  ?08/17/20 205 lb 9.6 oz (93.3 kg)  ? ? ?Physical Exam ?Constitutional:   ?   General: He is not in acute distress. ?   Appearance: He is well-developed. He is obese.  ?   Comments: NAD  ?Eyes:  ?   Conjunctiva/sclera: Conjunctivae normal.  ?   Pupils: Pupils are equal, round, and reactive to light.  ?Neck:  ?   Thyroid: No thyromegaly.  ?   Vascular: No JVD.  ?Cardiovascular:  ?   Rate and Rhythm: Normal rate and regular rhythm.  ?   Heart sounds: Normal heart sounds. No murmur heard. ?  No friction rub. No gallop.  ?Pulmonary:  ?   Effort: Pulmonary effort is normal. No respiratory distress.  ?   Breath sounds: Normal breath sounds. No wheezing or rales.  ?Chest:  ?   Chest wall: No tenderness.  ?Abdominal:  ?   General: Bowel sounds are normal. There is no distension.  ?   Palpations: Abdomen is soft. There is no mass.  ?  Tenderness: There is no abdominal tenderness. There is no guarding or rebound.  ?Musculoskeletal:     ?   General: No tenderness. Normal range of motion.  ?   Cervical back: Normal range of motion.  ?Lymphadenopathy:  ?   Cervical: No cervical adenopathy.  ?Skin: ?   General: Skin is warm and dry.  ?   Findings: No rash.  ?Neurological:  ?   Mental Status: He is alert and oriented to person, place, and time.  ?   Cranial Nerves: No cranial nerve deficit.  ?   Motor: No abnormal muscle tone.  ?   Coordination: Coordination normal.  ?   Gait: Gait normal.  ?   Deep Tendon Reflexes: Reflexes are normal and symmetric.  ?Psychiatric:     ?   Behavior: Behavior normal.     ?   Thought Content: Thought content normal.     ?   Judgment: Judgment normal.   ?Rectal - per Urology ? ?Lab Results  ?Component Value Date  ? WBC 7.8 11/22/2021  ? HGB 14.7 11/22/2021  ? HCT 44.9 11/22/2021  ? PLT 208.0 11/22/2021  ? GLUCOSE 97 11/22/2021  ? CHOL 131 11/22/2021  ? TRIG 125.0 11/22/2021  ? HDL 44.30 11/22/2021  ? [place] 62 11/22/2021  ? ALT 19 11/22/2021  ? AST 18 11/22/2021  ? NA 138 11/22/2021  ? K 4.7 11/22/2021  ? CL 105 11/22/2021  ? CREATININE 1.08 11/22/2021  ? BUN 14 11/22/2021  ? CO2 27 11/22/2021  ? TSH 1.89 11/22/2021  ? PSA 1.25 08/18/2020  ? HGBA1C 6.4 10/03/2014  ? ? ?VAS US CAROTID ? ?Result Date: 08/19/2019 ?Carotid Arterial Duplex Study Indications:      Bilateral bruits and patient denies any cerebrovascular                   symptoms. Risk Factors:     Hypertension, hyperlipidemia, past history of smoking. Comparison Study: NA Performing Technologist: Sharlett Iles RVT  Examination Guidelines: A complete evaluation includes B-mode imaging, spectral Doppler, color Doppler, and power Doppler as needed of all accessible portions of each vessel. Bilateral testing is considered an integral part of a complete examination. Limited examinations for reoccurring indications may be performed as noted.  Right Carotid Findings: +----------+--------+--------+--------+------------------+--------+           PSV cm/sEDV cm/sStenosisPlaque DescriptionComments +----------+--------+--------+--------+------------------+--------+ CCA Prox  114     14                                         +----------+--------+--------+--------+------------------+--------+ CCA Distal74      17                                         +----------+--------+--------+--------+------------------+--------+ ICA Prox  47      14                                         +----------+--------+--------+--------+------------------+--------+ ICA Mid   69      28                                         +----------+--------+--------+--------+------------------+--------+  ICA  Distal70      25                                         +----------+--------+--------+--------+------------------+--------+ +----------+--------+-------+----------------+-------------------+           PSV cm/sEDV cmsDescribe        Arm Pressure (mmHG) +----------+--------+-------+----------------+-------------------+ [id number]            Multiphasic, [id number]                 +----------+--------+-------+----------------+-------------------+ +---------+--------+--+--------+-+---------+ VertebralPSV cm/s31EDV cm/s8Antegrade +---------+--------+--+--------+-+---------+  Left Carotid Findings: +----------+--------+--------+--------+------------------+------------------+           PSV cm/sEDV cm/sStenosisPlaque DescriptionComments           +----------+--------+--------+--------+------------------+------------------+ CCA Prox  130     20                                                   +----------+--------+--------+--------+------------------+------------------+ CCA Distal70      17                                intimal thickening +----------+--------+--------+--------+------------------+------------------+ ICA Prox  43      13                                                   +----------+--------+--------+--------+------------------+------------------+ ICA Mid   56      20                                                   +----------+--------+--------+--------+------------------+------------------+ ICA Distal50      21                                                   +----------+--------+--------+--------+------------------+------------------+ ECA       102     13                                intimal thickening +----------+--------+--------+--------+------------------+------------------+ +----------+--------+--------+----------------+-------------------+           PSV cm/sEDV cm/sDescribe        Arm Pressure (mmHG)  +----------+--------+--------+----------------+-------------------+ [id number]             Multiphasic, [id number]                 +----------+--------+--------+----------------+-------------------+ +---------+--------+--+--------+--+---------+ VertebralPSV cm/s46EDV cm/s16Antegrade +---------+--------+--+--------+--+---------+   Summary: Right Carotid

## 2021-12-04 ENCOUNTER — Encounter: Payer: Self-pay | Admitting: Gastroenterology

## 2022-01-04 ENCOUNTER — Other Ambulatory Visit: Payer: Self-pay | Admitting: Internal Medicine

## 2022-01-18 ENCOUNTER — Encounter: Payer: Self-pay | Admitting: Nurse Practitioner

## 2022-02-05 ENCOUNTER — Ambulatory Visit: Payer: BC Managed Care – PPO | Admitting: Emergency Medicine

## 2022-02-13 ENCOUNTER — Encounter: Payer: Self-pay | Admitting: Physician Assistant

## 2022-02-13 ENCOUNTER — Ambulatory Visit (INDEPENDENT_AMBULATORY_CARE_PROVIDER_SITE_OTHER): Payer: BC Managed Care – PPO | Admitting: Physician Assistant

## 2022-02-13 DIAGNOSIS — K921 Melena: Secondary | ICD-10-CM

## 2022-02-13 DIAGNOSIS — K5900 Constipation, unspecified: Secondary | ICD-10-CM

## 2022-02-13 MED ORDER — NA SULFATE-K SULFATE-MG SULF 17.5-3.13-1.6 GM/177ML PO SOLN: 1.0000 | ORAL | 0 refills | Status: DC

## 2022-02-13 NOTE — Patient Instructions (Signed)
If you are age 78 or older, your body mass index should be between 23-30. Your Body mass index is 29.56 kg/m. If this is out of the aforementioned range listed, please consider follow up with your Primary Care Provider. ________________________________________________________  The [facility] providers would like to encourage you to use [facility] to communicate with providers for non-urgent requests or questions.  Due to long hold times on the telephone, sending your provider a message by [facility] may be a faster and more efficient way to get a response.  Please allow 48 business hours for a response.  Please remember that this is for non-urgent requests.  _______________________________________________________  [name] have been scheduled for a colonoscopy. Please follow written instructions given to you at your visit today.  Please pick up your prep supplies at the pharmacy within the next 1-3 days. If you use inhalers (even only as needed), please bring them with you on the day of your procedure.  Due to recent changes in healthcare laws, you may see the results of your imaging and laboratory studies on MyChart before your provider has had a chance to review them.  We understand that in some cases there may be results that are confusing or concerning to you. Not all laboratory results come back in the same time frame and the provider may be waiting for multiple results in order to interpret others.  Please give [place] 48 hours in order for your provider to thoroughly review all the results before contacting the office for clarification of your results.   You will follow up with our office per recommendations after colonoscopy.  Thank you for entrusting me with your care and choosing [facility].  Ellouise Newer

## 2022-02-13 NOTE — Progress Notes (Signed)
Chief Complaint: Hemorrhoids and discuss colonoscopy  HPI:     [name] is a 76 y/o [place] male, previously known to Dr. Deatra Ina, who was referred to me by Plotnikov, Evie Lacks, MD for a complaint of intermittent bleeding hemorrhoids and to discuss a colonoscopy.       01/23/2012 colonoscopy with proctitis and otherwise normal.  Repeat recommended in 10 years.  Biopsy showed acute self-limited proctitis.     02/12/2012 patient seen by Dr. Deatra Ina.  At that time was returning following a colonoscopy or mild proctitis was seen.  His Proctofoam had completely resolved his bleeding.  Apparently he had been drinking 14-15 bottles of green tea a day and when he stopped doing that his diarrhea stopped.    Patient requested to follow-up with Dr. Ardis Hughs.    11/22/2021 CBC and CMP normal.    Today, the patient presents to clinic and tells me he has always had some bright red blood with bowel movements which now occur about once every 2 to 3 days.  Tells me he has tried to use a stool softener in the past but somehow this resulted in muscle spasms especially if he has to be on his feet all day long.  Denies any rectal pain.  This has not changed for years.    Patient still works in Personal assistant, fixing roofs etc.    Denies fever, chills, weight loss, abdominal pain, rectal pain, heartburn or reflux.  Past Medical History:  Diagnosis Date   Degeneration of lumbar or lumbosacral intervertebral disc    Hemorrhoids    Hypertension    mild   Pure hypercholesterolemia    Unspecified hearing loss     Past Surgical History:  Procedure Laterality Date   TONSILLECTOMY     TRANSURETHRAL RESECTION OF BLADDER TUMOR  1999   benign polyp    Current Outpatient Medications  Medication Sig Dispense Refill   aspirin EC 81 MG tablet Take one tablet by mouth two times a day.     atorvastatin (LIPITOR) 40 MG tablet TAKE 1 TABLET BY MOUTH ONCE DAILY. 90 tablet 2   Cholecalciferol (VITAMIN D3) 1000 units CAPS Take  by mouth.     COVID-19 mRNA bivalent vaccine, Moderna, (MODERNA COVID-19 BIVAL BOOSTER) 50 MCG/0.5ML injection Inject into the muscle. 0.5 mL 0   Multiple Vitamin (MULTIVITAMIN) tablet Take 1 tablet by mouth 2 (two) times daily.      tamsulosin (FLOMAX) 0.4 MG CAPS capsule      ALPRAZolam (XANAX) 0.25 MG tablet Take 1-2 tablets (0.25-0.5 mg total) by mouth 2 (two) times daily. (Patient not taking: Reported on 02/13/2022) 60 tablet 1   furosemide (LASIX) 20 MG tablet TAKE 1 TABLET ONCE DAILY. (Patient not taking: Reported on 02/13/2022) 90 tablet 3   triazolam (HALCION) 0.25 MG tablet TAKE 1 TABLET BY MOUTH AT BEDTIME IF NEEDED FOR SLEEP. (Patient not taking: Reported on 02/13/2022) 30 tablet 1   No current facility-administered medications for this visit.    Allergies as of 02/13/2022   (No Known Allergies)    Family History  Problem Relation Age of Onset   Heart attack Mother        age 71   Heart disease Mother        CHF   Hyperlipidemia Mother    Heart attack Father 6       died of cardiovascular diease   Heart disease Father        CAD/MI   Heart attack  Brother 21       S/P CABG @ 13   Heart disease Brother        CAD/MI'@21'$ , CABG @ 28   Diabetes Maternal Grandmother    Colon cancer Cousin 72   Diabetes Cousin    Hyperlipidemia Other    Hypertension Other    Lung cancer Neg Hx    Prostate cancer Neg Hx    Cancer Neg Hx     Social History   Socioeconomic History   Marital status: Married    Spouse name: Not on file   Number of children: 2   Years of education: Not on file   Highest education level: Not on file  Occupational History   Occupation: Mudlogger: Building surveyor FOR SELF EMPLOYED  Tobacco Use   Smoking status: Former    Years: 15.00    Types: Cigarettes   Smokeless tobacco: Never  Vaping Use   Vaping Use: Never used  Substance and Sexual Activity   Alcohol use: No   Drug use: No   Sexual activity: Yes    Partners: Female   Other Topics Concern   Not on file  Social History Narrative   HSG, [place]. Married- 1987. 2 sons-'91 [employer], '94-headed for [employer]. 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   Social Determinants of Health   Financial Resource Strain: Not on file  Food Insecurity: Not on file  Transportation Needs: Not on file  Physical Activity: Not on file  Stress: Not on file  Social Connections: Not on file  Intimate Partner Violence: Not on file    Review of Systems:    Constitutional: No weight loss, fever or chills Skin: No rash  Cardiovascular: No chest pain Respiratory: No SOB Gastrointestinal: See HPI and otherwise negative Genitourinary: No dysuria  Neurological: No headache, dizziness or syncope Musculoskeletal: No new muscle or joint pain Hematologic: No bruising Psychiatric: No history of depression or anxiety   Physical Exam:  Vital signs: BP 132/82   Pulse 84   Ht '5\' 10"'$  (1.778 m)   Wt 206 lb (93.4 kg)   BMI 29.56 kg/m    Constitutional:   Pleasant Elderly Caucasian male appears to be in NAD, Well developed, Well nourished, alert and cooperative Head:  Normocephalic and atraumatic. Eyes:   PEERL, EOMI. No icterus. Conjunctiva pink. Ears:  Normal auditory acuity. Neck:  Supple Throat: Oral cavity and pharynx without inflammation, swelling or lesion.  Respiratory: Respirations even and unlabored. Lungs clear to auscultation bilaterally.   No wheezes, crackles, or rhonchi.  Cardiovascular: Normal S1, S2. No MRG. Regular rate and rhythm. No peripheral edema, cyanosis or pallor.  Gastrointestinal:  Soft, nondistended, nontender. No rebound or guarding. Normal bowel sounds. No appreciable masses or hepatomegaly. Rectal:  Declined Msk:  Symmetrical without gross deformities. Without edema, no deformity or joint abnormality.  Neurologic:  Alert and  oriented x4;  grossly normal neurologically.  Skin:   Dry and intact without  significant lesions or rashes. Psychiatric:  Demonstrates good judgement and reason without abnormal affect or behaviors.  RELEVANT LABS AND IMAGING: CBC    Component Value Date/Time   WBC 7.8 11/22/2021 1603   RBC 5.27 11/22/2021 1603   HGB 14.7 11/22/2021 1603   HCT 44.9 11/22/2021 1603   PLT 208.0 11/22/2021 1603   MCV 85.3 11/22/2021 1603   MCHC 32.7 11/22/2021 1603   RDW 15.1 11/22/2021 1603   LYMPHSABS 2.2 11/22/2021 1603   MONOABS 0.8  11/22/2021 1603   EOSABS 0.2 11/22/2021 1603   BASOSABS 0.0 11/22/2021 1603    CMP     Component Value Date/Time   NA 138 11/22/2021 1603   K 4.7 11/22/2021 1603   CL 105 11/22/2021 1603   CO2 27 11/22/2021 1603   GLUCOSE 97 11/22/2021 1603   BUN 14 11/22/2021 1603   CREATININE 1.08 11/22/2021 1603   CREATININE 1.24 01/31/2013 0914   CALCIUM 9.1 11/22/2021 1603   PROT 7.2 11/22/2021 1603   ALBUMIN 4.3 11/22/2021 1603   AST 18 11/22/2021 1603   ALT 19 11/22/2021 1603   ALKPHOS 99 11/22/2021 1603   BILITOT 0.8 11/22/2021 1603   GFRNONAA 79.26 05/21/2010 1055    Assessment: 1.  Hematochezia: Every time he has a bowel movement sees bright red blood in the stool in the toilet paper over the past many years; question relation to proctitis which was supposed to be self-limited versus hemorrhoids  2.  Constipation: Bowel movement every 2 to 3 days, apparently had side effects from stool softeners including muscle spasms in the past; likely age/diet related 3.  History of proctitis: On colonoscopy in 2013, per biopsies this was self-limited but patient has continued with rectal bleeding/hematochezia, question relation to this  Plan: 1.  Scheduled patient for diagnostic colonoscopy in the [facility].  Patient requests Dr. Ardis Hughs and had previously been accepted by him years ago.  Did provide the patient a detailed list of risks for the procedure and he agrees to proceed. Patient is appropriate for endoscopic procedure(s) in the ambulatory ([facility])  setting.  2.  Patient will complete a 2-day bowel prep given history of constipation. 3.  Discussed using MiraLAX on a daily basis to help with constipation. 4.  Patient to follow in clinic per recommendations from Dr. Ardis Hughs after time of procedure.  Ellouise Newer, PA-C [facility] 02/13/2022, 8:42 AM  Cc: Cassandria Anger, MD

## 2022-02-18 NOTE — Progress Notes (Signed)
Attending Physician's Attestation   I have reviewed the chart.   I agree with the Advanced Practitioner's note, impression, and recommendations with any updates as below. In Dr. Eugenia Pancoast absence I have reviewed this patient's chart.  I agree with PA Lemmon's evaluation and assessment.  Although the patient has asked for transition of care to Dr. Ardis Hughs, that will not be possible for the coming months.  Pending how long the patient will want to wait, he can be transition to my care or one of the other providers in a similar time scale that had already been outlined/scheduled.   Justice Britain, MD [facility] Advanced Endoscopy Office # [phone]

## 2022-02-19 NOTE — Progress Notes (Signed)
The pt has been rescheduled to 9/13 at 10 am with Dr Rush Landmark.  He has been advised of the new arrival time.  He will call back with any questions or concerns.

## 2022-03-27 ENCOUNTER — Encounter: Payer: Self-pay | Admitting: Gastroenterology

## 2022-03-27 ENCOUNTER — Ambulatory Visit (AMBULATORY_SURGERY_CENTER): Payer: BC Managed Care – PPO | Admitting: Gastroenterology

## 2022-03-27 ENCOUNTER — Encounter: Payer: BC Managed Care – PPO | Admitting: Gastroenterology

## 2022-03-27 VITALS — BP 117/78 | HR 62 | Temp 95.5°F | Resp 12 | Ht 70.0 in | Wt 206.0 lb

## 2022-03-27 DIAGNOSIS — K641 Second degree hemorrhoids: Secondary | ICD-10-CM

## 2022-03-27 DIAGNOSIS — D123 Benign neoplasm of transverse colon: Secondary | ICD-10-CM | POA: Diagnosis not present

## 2022-03-27 DIAGNOSIS — D122 Benign neoplasm of ascending colon: Secondary | ICD-10-CM | POA: Diagnosis not present

## 2022-03-27 DIAGNOSIS — K921 Melena: Secondary | ICD-10-CM

## 2022-03-27 DIAGNOSIS — K5900 Constipation, unspecified: Secondary | ICD-10-CM

## 2022-03-27 MED ORDER — SODIUM CHLORIDE 0.9 % IV SOLN: 500.0000 mL | Freq: Once | INTRAVENOUS | Status: DC

## 2022-03-27 MED ORDER — HYDROCORTISONE ACETATE 25 MG RE SUPP: RECTAL | 0 refills | Status: DC

## 2022-03-27 NOTE — Progress Notes (Signed)
Report to PACU, RN, vss, BBS= Clear.  

## 2022-03-27 NOTE — Op Note (Addendum)
[facility] Patient Name: [name] Procedure Date: 03/27/2022 10:01 AM MRN: [id number] Endoscopist: Justice Britain , MD Age: 76 Referring MD:  Date of Birth: 1946/05/26 Gender: Male Account #: [id number] Procedure:                Colonoscopy Indications:              Screening for colorectal malignant neoplasm,                            Incidental - Hematochezia Medicines:                Monitored Anesthesia Care Procedure:                Pre-Anesthesia Assessment:                           - Prior to the procedure, a History and Physical                            was performed, and patient medications and                            allergies were reviewed. The patient's tolerance of                            previous anesthesia was also reviewed. The risks                            and benefits of the procedure and the sedation                            options and risks were discussed with the patient.                            All questions were answered, and informed consent                            was obtained. Prior Anticoagulants: The patient has                            taken no previous anticoagulant or antiplatelet                            agents except for aspirin. ASA Grade Assessment: II                            - A patient with mild systemic disease. After                            reviewing the risks and benefits, the patient was                            deemed in satisfactory condition to undergo the  procedure.                           After obtaining informed consent, the colonoscope                            was passed under direct vision. Throughout the                            procedure, the patient's blood pressure, pulse, and                            oxygen saturations were monitored continuously. The                            Olympus CF-HQ190L [id number]) Colonoscope was                             introduced through the anus and advanced to the 5                            cm into the ileum. The colonoscopy was performed                            without difficulty. The patient tolerated the                            procedure. The quality of the bowel preparation was                            adequate. The terminal ileum, ileocecal valve,                            appendiceal orifice, and rectum were photographed. Scope In: 10:11:50 AM Scope Out: 10:27:56 AM Scope Withdrawal Time: 0 hours 12 minutes 15 seconds  Total Procedure Duration: 0 hours 16 minutes 6 seconds  Findings:                 The digital rectal exam findings include                            hemorrhoids. Pertinent negatives include no                            palpable rectal lesions.                           The terminal ileum and ileocecal valve appeared                            normal.                           Two sessile polyps were found in the transverse  colon and ascending colon. The polyps were 2 to 3                            mm in size. These polyps were removed with a cold                            snare. Resection and retrieval were complete.                           Normal mucosa was found in the entire colon                            otherwise.                           Non-bleeding non-thrombosed external and internal                            hemorrhoids were found during retroflexion, during                            perianal exam and during digital exam. The                            hemorrhoids were Grade II (internal hemorrhoids                            that prolapse but reduce spontaneously). Complications:            No immediate complications. Estimated Blood Loss:     Estimated blood loss was minimal. Impression:               - Hemorrhoids found on digital rectal exam.                           - The examined portion of the ileum was  normal.                           - Two 2 to 3 mm polyps in the transverse colon and                            in the ascending colon, removed with a cold snare.                            Resected and retrieved.                           - Normal mucosa in the entire examined colon                            otherwise.                           - Non-bleeding non-thrombosed external and internal  hemorrhoids. Recommendation:           - The patient will be observed post-procedure,                            until all discharge criteria are met.                           - Discharge patient to home.                           - Patient has a contact number available for                            emergencies. The signs and symptoms of potential                            delayed complications were discussed with the                            patient. Return to normal activities tomorrow.                            Written discharge instructions were provided to the                            patient.                           - High fiber diet.                           - Use FiberCon 1-2 tablets PO daily.                           - Consider Anusol suppositories in future vs OTC                            Calmol-4 suppositories.                           - If rectal bleeding persists can discuss with one                            of my colleagues for consideration of internal                            hemorrhoidal banding attempt.                           - Continue present medications.                           - Await pathology results.                           - Repeat colonoscopy in 5-10 years for surveillance  based on pathology results and based on patient's                            health/medical comorbidities at the time, he may                            not need further colonoscopies if this is greater                             than a 5-year follow up unless health remains                            excellent.                           - The findings and recommendations were discussed                            with the patient.                           - The findings and recommendations were discussed                            with the patient's family. Justice Britain, MD 03/27/2022 10:32:09 AM

## 2022-03-27 NOTE — Patient Instructions (Addendum)
-   High fiber diet. - Use FiberCon 1-2 tablets PO daily. - Consider Anusol suppositories in future vs OTC Calmol-4 suppositories. - If rectal bleeding persists can discuss with one of my colleagues for consideration of internal hemorrhoidal banding attempt. Follow up with Dr.Mansourati in 3-6 weeks  YOU HAD AN ENDOSCOPIC PROCEDURE TODAY: Refer to the procedure report and other information in the discharge instructions given to you for any specific questions about what was found during the examination. If this information does not answer your questions, please call [facility] office at [phone] to clarify.   YOU SHOULD EXPECT: Some feelings of bloating in the abdomen. Passage of more gas than usual. Walking can help get rid of the air that was put into your GI tract during the procedure and reduce the bloating. If you had a lower endoscopy (such as a colonoscopy or flexible sigmoidoscopy) you may notice spotting of blood in your stool or on the toilet paper. Some abdominal soreness may be present for a day or two, also.  DIET: Your first meal following the procedure should be a light meal and then it is ok to progress to your normal diet. A half-sandwich or bowl of soup is an example of a good first meal. Heavy or fried foods are harder to digest and may make you feel nauseous or bloated. Drink plenty of fluids but you should avoid alcoholic beverages for 24 hours. If you had a esophageal dilation, please see attached instructions for diet.    ACTIVITY: Your care partner should take you home directly after the procedure. You should plan to take it easy, moving slowly for the rest of the day. You can resume normal activity the day after the procedure however YOU SHOULD NOT DRIVE, use power tools, machinery or perform tasks that involve climbing or major physical exertion for 24 hours (because of the sedation medicines used during the test).   SYMPTOMS TO REPORT IMMEDIATELY: A gastroenterologist can  be reached at any hour. Please call [phone]  for any of the following symptoms:  Following lower endoscopy (colonoscopy, flexible sigmoidoscopy) Excessive amounts of blood in the stool  Significant tenderness, worsening of abdominal pains  Swelling of the abdomen that is new, acute  Fever of 100 or higher  Following upper endoscopy (EGD, EUS, ERCP, esophageal dilation) Vomiting of blood or coffee ground material  New, significant abdominal pain  New, significant chest pain or pain under the shoulder blades  Painful or persistently difficult swallowing  New shortness of breath  Black, tarry-looking or red, bloody stools  FOLLOW UP:  If any biopsies were taken you will be contacted by phone or by letter within the next 1-3 weeks. Call [phone]  if you have not heard about the biopsies in 3 weeks.  Please also call with any specific questions about appointments or follow up tests.

## 2022-03-27 NOTE — Progress Notes (Signed)
GASTROENTEROLOGY PROCEDURE H&P NOTE   Primary Care Physician: Cassandria Anger, MD  HPI: [name] is a 76 y.o. male who presents for Colonoscopy for screening and previous history of proctitis with some prior hematochezia.  Past Medical History:  Diagnosis Date   Degeneration of lumbar or lumbosacral intervertebral disc    Hemorrhoids    Hypertension    mild   Pure hypercholesterolemia    Unspecified hearing loss    Past Surgical History:  Procedure Laterality Date   TONSILLECTOMY     TRANSURETHRAL RESECTION OF BLADDER TUMOR  1999   benign polyp   Current Outpatient Medications  Medication Sig Dispense Refill   atorvastatin (LIPITOR) 40 MG tablet TAKE 1 TABLET BY MOUTH ONCE DAILY. 90 tablet 2   Cholecalciferol (VITAMIN D3) 1000 units CAPS Take by mouth.     Multiple Vitamin (MULTIVITAMIN) tablet Take 1 tablet by mouth 2 (two) times daily.      tamsulosin (FLOMAX) 0.4 MG CAPS capsule      ALPRAZolam (XANAX) 0.25 MG tablet Take 1-2 tablets (0.25-0.5 mg total) by mouth 2 (two) times daily. (Patient not taking: Reported on 02/13/2022) 60 tablet 1   aspirin EC 81 MG tablet Take one tablet by mouth two times a day.     COVID-19 mRNA bivalent vaccine, Moderna, (MODERNA COVID-19 BIVAL BOOSTER) 50 MCG/0.5ML injection Inject into the muscle. 0.5 mL 0   furosemide (LASIX) 20 MG tablet TAKE 1 TABLET ONCE DAILY. (Patient not taking: Reported on 02/13/2022) 90 tablet 3   triazolam (HALCION) 0.25 MG tablet TAKE 1 TABLET BY MOUTH AT BEDTIME IF NEEDED FOR SLEEP. (Patient not taking: Reported on 02/13/2022) 30 tablet 1   Current Facility-Administered Medications  Medication Dose Route Frequency Provider Last Rate Last Admin   0.9 %  sodium chloride infusion  500 mL Intravenous Once Mansouraty, Telford Nab., MD        Current Outpatient Medications:    atorvastatin (LIPITOR) 40 MG tablet, TAKE 1 TABLET BY MOUTH ONCE DAILY., Disp: 90 tablet, Rfl: 2   Cholecalciferol (VITAMIN D3) 1000  units CAPS, Take by mouth., Disp: , Rfl:    Multiple Vitamin (MULTIVITAMIN) tablet, Take 1 tablet by mouth 2 (two) times daily. , Disp: , Rfl:    tamsulosin (FLOMAX) 0.4 MG CAPS capsule, , Disp: , Rfl:    ALPRAZolam (XANAX) 0.25 MG tablet, Take 1-2 tablets (0.25-0.5 mg total) by mouth 2 (two) times daily. (Patient not taking: Reported on 02/13/2022), Disp: 60 tablet, Rfl: 1   aspirin EC 81 MG tablet, Take one tablet by mouth two times a day., Disp: , Rfl:    COVID-19 mRNA bivalent vaccine, Moderna, (MODERNA COVID-19 BIVAL BOOSTER) 50 MCG/0.5ML injection, Inject into the muscle., Disp: 0.5 mL, Rfl: 0   furosemide (LASIX) 20 MG tablet, TAKE 1 TABLET ONCE DAILY. (Patient not taking: Reported on 02/13/2022), Disp: 90 tablet, Rfl: 3   triazolam (HALCION) 0.25 MG tablet, TAKE 1 TABLET BY MOUTH AT BEDTIME IF NEEDED FOR SLEEP. (Patient not taking: Reported on 02/13/2022), Disp: 30 tablet, Rfl: 1  Current Facility-Administered Medications:    0.9 %  sodium chloride infusion, 500 mL, Intravenous, Once, Mansouraty, Telford Nab., MD No Known Allergies Family History  Problem Relation Age of Onset   Heart attack Mother        age 71   Heart disease Mother        CHF   Hyperlipidemia Mother    Heart attack Father 55  died of cardiovascular diease   Heart disease Father        CAD/MI   Heart attack Brother 60       S/P CABG @ 58   Heart disease Brother        CAD/MI'@21'$ , CABG @ 28   Diabetes Maternal Grandmother    Colon cancer Cousin 72   Diabetes Cousin    Hyperlipidemia Other    Hypertension Other    Lung cancer Neg Hx    Prostate cancer Neg Hx    Cancer Neg Hx    Social History   Socioeconomic History   Marital status: Married    Spouse name: Not on file   Number of children: 2   Years of education: Not on file   Highest education level: Not on file  Occupational History   Occupation: Mudlogger: Building surveyor FOR SELF EMPLOYED  Tobacco Use   Smoking status:  Former    Years: 15.00    Types: Cigarettes   Smokeless tobacco: Never  Vaping Use   Vaping Use: Never used  Substance and Sexual Activity   Alcohol use: No   Drug use: No   Sexual activity: Yes    Partners: Female  Other Topics Concern   Not on file  Social History Narrative   HSG, [place]. Married- 1987. 2 sons-'91 [employer], '94-headed for [employer]. 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   Social Determinants of Health   Financial Resource Strain: Not on file  Food Insecurity: Not on file  Transportation Needs: Not on file  Physical Activity: Not on file  Stress: Not on file  Social Connections: Not on file  Intimate Partner Violence: Not on file    Physical Exam: Today's Vitals   03/27/22 0908 03/27/22 0909  BP: 125/87   Pulse: 70   Temp: (!) 95.5 F (35.3 C) (!) 95.5 F (35.3 C)  SpO2: 98%   Weight: 206 lb (93.4 kg)   Height: '5\' 10"'$  (1.778 m)    Body mass index is 29.56 kg/m. GEN: NAD EYE: Sclerae anicteric ENT: MMM CV: Non-tachycardic GI: Soft, NT/ND NEURO:  Alert & Oriented x 3  Lab Results: No results for input(s): "WBC", "HGB", "HCT", "PLT" in the last 72 hours. BMET No results for input(s): "NA", "K", "CL", "CO2", "GLUCOSE", "BUN", "CREATININE", "CALCIUM" in the last 72 hours. LFT No results for input(s): "PROT", "ALBUMIN", "AST", "ALT", "ALKPHOS", "BILITOT", "BILIDIR", "IBILI" in the last 72 hours. PT/INR No results for input(s): "LABPROT", "INR" in the last 72 hours.   Impression / Plan: This is a 76 y.o.male who presents for Colonoscopy for screening and previous history of proctitis with some prior hematochezia.  The risks and benefits of endoscopic evaluation/treatment were discussed with the patient and/or family; these include but are not limited to the risk of perforation, infection, bleeding, missed lesions, lack of diagnosis, severe illness requiring hospitalization, as well as anesthesia and  sedation related illnesses.  The patient's history has been reviewed, patient examined, no change in status, and deemed stable for procedure.  The patient and/or family is agreeable to proceed.    Justice Britain, MD [facility] Office # [phone]

## 2022-03-27 NOTE — Progress Notes (Signed)
Called to room to assist during endoscopic procedure.  Patient ID and intended procedure confirmed with present staff. Received instructions for my participation in the procedure from the performing physician.  

## 2022-03-28 ENCOUNTER — Telehealth: Payer: Self-pay

## 2022-03-28 NOTE — Telephone Encounter (Signed)
No answer, left message to call if having any issues or concerns, B.Jackquline Branca RN 

## 2022-03-29 ENCOUNTER — Encounter: Payer: Self-pay | Admitting: Gastroenterology

## 2022-06-18 ENCOUNTER — Ambulatory Visit: Payer: BC Managed Care – PPO | Admitting: Internal Medicine

## 2022-06-20 ENCOUNTER — Encounter: Payer: Self-pay | Admitting: Internal Medicine

## 2022-06-20 ENCOUNTER — Ambulatory Visit (INDEPENDENT_AMBULATORY_CARE_PROVIDER_SITE_OTHER): Payer: BC Managed Care – PPO | Admitting: Internal Medicine

## 2022-06-20 VITALS — BP 135/80 | HR 63 | Temp 98.4°F | Ht 70.0 in | Wt 205.0 lb

## 2022-06-20 DIAGNOSIS — K5900 Constipation, unspecified: Secondary | ICD-10-CM | POA: Diagnosis not present

## 2022-06-20 DIAGNOSIS — N320 Bladder-neck obstruction: Secondary | ICD-10-CM | POA: Diagnosis not present

## 2022-06-20 DIAGNOSIS — E785 Hyperlipidemia, unspecified: Secondary | ICD-10-CM

## 2022-06-20 DIAGNOSIS — I10 Essential (primary) hypertension: Secondary | ICD-10-CM

## 2022-06-20 MED ORDER — FUROSEMIDE 20 MG PO TABS: 20.0000 mg | ORAL_TABLET | Freq: Every day | ORAL | 3 refills | Status: DC

## 2022-06-20 NOTE — Progress Notes (Signed)
Subjective:  Patient ID: [name], male    DOB: 1945-08-22  Age: 76 y.o. MRN: [id number]  CC: Hypertension   HPI [name] presents for elevated BP at home: SBP 130-160  Me: 135/80 L - on Furosemide now Machine 160/99 L; 155/105   Outpatient Medications Prior to Visit  Medication Sig Dispense Refill   aspirin EC 81 MG tablet Take one tablet by mouth two times a day.     atorvastatin (LIPITOR) 40 MG tablet TAKE 1 TABLET BY MOUTH ONCE DAILY. 90 tablet 2   Cholecalciferol (VITAMIN D3) 1000 units CAPS Take by mouth.     hydrocortisone (ANUSOL-HC) 25 MG suppository Use suppository daily x 3 nights, then change to 1 supp every other night as needed 12 suppository 0   Multiple Vitamin (MULTIVITAMIN) tablet Take 1 tablet by mouth 2 (two) times daily.      tamsulosin (FLOMAX) 0.4 MG CAPS capsule      COVID-19 mRNA bivalent vaccine, Moderna, (MODERNA COVID-19 BIVAL BOOSTER) 50 MCG/0.5ML injection Inject into the muscle. 0.5 mL 0   ALPRAZolam (XANAX) 0.25 MG tablet Take 1-2 tablets (0.25-0.5 mg total) by mouth 2 (two) times daily. (Patient not taking: Reported on 02/13/2022) 60 tablet 1   triazolam (HALCION) 0.25 MG tablet TAKE 1 TABLET BY MOUTH AT BEDTIME IF NEEDED FOR SLEEP. (Patient not taking: Reported on 02/13/2022) 30 tablet 1   furosemide (LASIX) 20 MG tablet TAKE 1 TABLET ONCE DAILY. (Patient not taking: Reported on 02/13/2022) 90 tablet 3   No facility-administered medications prior to visit.    ROS: Review of Systems  Constitutional:  Negative for appetite change and unexpected weight change.  HENT:  Negative for congestion, nosebleeds, sneezing, sore throat and trouble swallowing.   Eyes:  Negative for itching and visual disturbance.  Respiratory:  Negative for cough.   Cardiovascular:  Negative for chest pain, palpitations and leg swelling.  Gastrointestinal:  Positive for constipation. Negative for abdominal distention, blood in stool, diarrhea and nausea.  Genitourinary:   Negative for frequency and hematuria.  Musculoskeletal:  Negative for back pain, gait problem, joint swelling and neck pain.  Skin:  Negative for rash.  Neurological:  Negative for dizziness, tremors, speech difficulty and weakness.  Psychiatric/Behavioral:  Negative for agitation, dysphoric mood and sleep disturbance. The patient is not nervous/anxious.     Objective:  BP 135/80 (Patient Position: Sitting)   Pulse 63   Temp 98.4 F (36.9 C) (Oral)   Ht '5\' 10"'$  (1.778 m)   Wt 205 lb (93 kg)   SpO2 98%   BMI 29.41 kg/m   BP Readings from Last 3 Encounters:  06/20/22 135/80  03/27/22 117/78  02/13/22 132/82    Wt Readings from Last 3 Encounters:  06/20/22 205 lb (93 kg)  03/27/22 206 lb (93.4 kg)  02/13/22 206 lb (93.4 kg)    Physical Exam Constitutional:      General: He is not in acute distress.    Appearance: He is well-developed. He is obese.     Comments: NAD  Eyes:     Conjunctiva/sclera: Conjunctivae normal.     Pupils: Pupils are equal, round, and reactive to light.  Neck:     Thyroid: No thyromegaly.     Vascular: No JVD.  Cardiovascular:     Rate and Rhythm: Normal rate and regular rhythm.     Heart sounds: Normal heart sounds. No murmur heard.    No friction rub. No gallop.  Pulmonary:  Effort: Pulmonary effort is normal. No respiratory distress.     Breath sounds: Normal breath sounds. No wheezing or rales.  Chest:     Chest wall: No tenderness.  Abdominal:     General: Bowel sounds are normal. There is no distension.     Palpations: Abdomen is soft. There is no mass.     Tenderness: There is no abdominal tenderness. There is no guarding or rebound.  Musculoskeletal:        General: No tenderness. Normal range of motion.     Cervical back: Normal range of motion.  Lymphadenopathy:     Cervical: No cervical adenopathy.  Skin:    General: Skin is warm and dry.     Findings: No rash.  Neurological:     Mental Status: He is alert and oriented to  person, place, and time.     Cranial Nerves: No cranial nerve deficit.     Motor: No abnormal muscle tone.     Coordination: Coordination normal.     Gait: Gait normal.     Deep Tendon Reflexes: Reflexes are normal and symmetric.  Psychiatric:        Behavior: Behavior normal.        Thought Content: Thought content normal.        Judgment: Judgment normal.     Lab Results  Component Value Date   WBC 7.8 11/22/2021   HGB 14.7 11/22/2021   HCT 44.9 11/22/2021   PLT 208.0 11/22/2021   GLUCOSE 97 11/22/2021   CHOL 131 11/22/2021   TRIG 125.0 11/22/2021   HDL 44.30 11/22/2021   LDLCALC 62 11/22/2021   ALT 19 11/22/2021   AST 18 11/22/2021   NA 138 11/22/2021   K 4.7 11/22/2021   CL 105 11/22/2021   CREATININE 1.08 11/22/2021   BUN 14 11/22/2021   CO2 27 11/22/2021   TSH 1.89 11/22/2021   PSA 1.25 08/18/2020   HGBA1C 6.4 10/03/2014    VAS US CAROTID  Result Date: 08/19/2019 Carotid Arterial Duplex Study Indications:      Bilateral bruits and patient denies any cerebrovascular                   symptoms. Risk Factors:     Hypertension, hyperlipidemia, past history of smoking. Comparison Study: NA Performing Technologist: Sharlett Iles RVT  Examination Guidelines: A complete evaluation includes B-mode imaging, spectral Doppler, color Doppler, and power Doppler as needed of all accessible portions of each vessel. Bilateral testing is considered an integral part of a complete examination. Limited examinations for reoccurring indications may be performed as noted.  Right Carotid Findings: +----------+--------+--------+--------+------------------+--------+           PSV cm/sEDV cm/sStenosisPlaque DescriptionComments +----------+--------+--------+--------+------------------+--------+ CCA Prox  114     14                                         +----------+--------+--------+--------+------------------+--------+ CCA Distal74      17                                          +----------+--------+--------+--------+------------------+--------+ ICA Prox  47      14                                         +----------+--------+--------+--------+------------------+--------+  ICA Mid   69      28                                         +----------+--------+--------+--------+------------------+--------+ ICA Distal70      25                                         +----------+--------+--------+--------+------------------+--------+ +----------+--------+-------+----------------+-------------------+           PSV cm/sEDV cmsDescribe        Arm Pressure (mmHG) +----------+--------+-------+----------------+-------------------+ [id number]            Multiphasic, [id number]                 +----------+--------+-------+----------------+-------------------+ +---------+--------+--+--------+-+---------+ VertebralPSV cm/s31EDV cm/s8Antegrade +---------+--------+--+--------+-+---------+  Left Carotid Findings: +----------+--------+--------+--------+------------------+------------------+           PSV cm/sEDV cm/sStenosisPlaque DescriptionComments           +----------+--------+--------+--------+------------------+------------------+ CCA Prox  130     20                                                   +----------+--------+--------+--------+------------------+------------------+ CCA Distal70      17                                intimal thickening +----------+--------+--------+--------+------------------+------------------+ ICA Prox  43      13                                                   +----------+--------+--------+--------+------------------+------------------+ ICA Mid   56      20                                                   +----------+--------+--------+--------+------------------+------------------+ ICA Distal50      21                                                    +----------+--------+--------+--------+------------------+------------------+ ECA       102     13                                intimal thickening +----------+--------+--------+--------+------------------+------------------+ +----------+--------+--------+----------------+-------------------+           PSV cm/sEDV cm/sDescribe        Arm Pressure (mmHG) +----------+--------+--------+----------------+-------------------+ [id number]             Multiphasic, [id number]                 +----------+--------+--------+----------------+-------------------+ +---------+--------+--+--------+--+---------+ VertebralPSV cm/s46EDV cm/s16Antegrade +---------+--------+--+--------+--+---------+   Summary: Right Carotid: There was no evidence of thrombus, dissection, atherosclerotic  plaque or stenosis in the cervical carotid system. Left Carotid: The extracranial vessels were near-normal with only minimal wall               thickening or plaque. Vertebrals:  Bilateral vertebral arteries demonstrate antegrade flow. Subclavians: Normal flow hemodynamics were seen in bilateral subclavian              arteries. *See table(s) above for measurements and observations.  Electronically signed by Larae Grooms MD on 08/19/2019 at 9:49:46 AM.    Final     Assessment & Plan:   Problem List Items Addressed This Visit     Constipation   Relevant Orders   Lipid panel   HTN (hypertension) - Primary    Elevated BP at home: SBP 130-160 Me: 135/80 L elevated BP at home: SBP 130-160  Cont on Furosemide now (re-started) NAS diet      Relevant Medications   furosemide (LASIX) 20 MG tablet   Other Relevant Orders   TSH   Urinalysis   CBC with Differential/Platelet   Comprehensive metabolic panel   Other Visit Diagnoses     Dyslipidemia (high LDL; low HDL)       Relevant Orders   Lipid panel   Comprehensive metabolic panel   Dyslipidemia       Relevant Orders   TSH   Lipid panel    Bladder neck obstruction       Relevant Orders   PSA         Meds ordered this encounter  Medications   furosemide (LASIX) 20 MG tablet    Sig: Take 1 tablet (20 mg total) by mouth daily.    Dispense:  90 tablet    Refill:  3      Follow-up: Return in about 3 months (around 09/19/2022) for a follow-up visit.  Walker Kehr, MD

## 2022-06-20 NOTE — Assessment & Plan Note (Signed)
Elevated BP at home: SBP 130-160 Me: 135/80 L elevated BP at home: SBP 130-160  Cont on Furosemide now (re-started) NAS diet

## 2022-06-27 ENCOUNTER — Encounter: Payer: Self-pay | Admitting: Internal Medicine

## 2022-09-30 ENCOUNTER — Other Ambulatory Visit: Payer: Self-pay | Admitting: Internal Medicine

## 2023-01-06 ENCOUNTER — Other Ambulatory Visit: Payer: Self-pay | Admitting: Internal Medicine

## 2023-01-17 ENCOUNTER — Other Ambulatory Visit: Payer: Self-pay

## 2023-01-17 MED ORDER — ATORVASTATIN CALCIUM 40 MG PO TABS: 40.0000 mg | ORAL_TABLET | Freq: Every day | ORAL | 0 refills | Status: DC

## 2023-01-29 ENCOUNTER — Encounter: Payer: Self-pay | Admitting: Internal Medicine

## 2023-01-29 ENCOUNTER — Ambulatory Visit (INDEPENDENT_AMBULATORY_CARE_PROVIDER_SITE_OTHER): Payer: BC Managed Care – PPO | Admitting: Internal Medicine

## 2023-01-29 VITALS — BP 110/70 | HR 65 | Temp 98.2°F | Ht 70.0 in | Wt 205.0 lb

## 2023-01-29 DIAGNOSIS — E785 Hyperlipidemia, unspecified: Secondary | ICD-10-CM

## 2023-01-29 DIAGNOSIS — I10 Essential (primary) hypertension: Secondary | ICD-10-CM | POA: Diagnosis not present

## 2023-01-29 DIAGNOSIS — Z0000 Encounter for general adult medical examination without abnormal findings: Secondary | ICD-10-CM

## 2023-01-29 DIAGNOSIS — E7800 Pure hypercholesterolemia, unspecified: Secondary | ICD-10-CM

## 2023-01-29 DIAGNOSIS — N320 Bladder-neck obstruction: Secondary | ICD-10-CM

## 2023-01-29 DIAGNOSIS — K5900 Constipation, unspecified: Secondary | ICD-10-CM

## 2023-01-29 LAB — COMPREHENSIVE METABOLIC PANEL
ALT: 20 U/L (ref 0–53)
AST: 21 U/L (ref 0–37)
Albumin: 4.6 g/dL (ref 3.5–5.2)
Alkaline Phosphatase: 99 U/L (ref 39–117)
BUN: 15 mg/dL (ref 6–23)
CO2: 25 mEq/L (ref 19–32)
Calcium: 9.6 mg/dL (ref 8.4–10.5)
Chloride: 101 mEq/L (ref 96–112)
Creatinine, Ser: 1.01 mg/dL (ref 0.40–1.50)
GFR: 70.73 mL/min (ref >60.00)
Glucose, Bld: 100 mg/dL — ABNORMAL HIGH (ref 70–99)
Potassium: 4 mEq/L (ref 3.5–5.1)
Sodium: 135 mEq/L (ref 135–145)
Total Bilirubin: 1.2 mg/dL (ref 0.2–1.2)
Total Protein: 7.3 g/dL (ref 6.0–8.3)

## 2023-01-29 LAB — LIPID PANEL
Cholesterol: 113 mg/dL (ref 0–200)
HDL: 41.5 mg/dL (ref >39.00)
LDL Cholesterol: 58 mg/dL (ref 0–99)
NonHDL: 71.94
Total CHOL/HDL Ratio: 3
Triglycerides: 71 mg/dL (ref 0.0–149.0)
VLDL: 14.2 mg/dL (ref 0.0–40.0)

## 2023-01-29 LAB — CBC WITH DIFFERENTIAL/PLATELET
Basophils Absolute: 0 10*3/uL (ref 0.0–0.1)
Basophils Relative: 0.3 % (ref 0.0–3.0)
Eosinophils Absolute: 0.1 10*3/uL (ref 0.0–0.7)
Eosinophils Relative: 1.5 % (ref 0.0–5.0)
HCT: 46.1 % (ref 39.0–52.0)
Hemoglobin: 15 g/dL (ref 13.0–17.0)
Lymphocytes Relative: 26.6 % (ref 12.0–46.0)
Lymphs Abs: 2.1 10*3/uL (ref 0.7–4.0)
MCHC: 32.6 g/dL (ref 30.0–36.0)
MCV: 85.5 fl (ref 78.0–100.0)
Monocytes Absolute: 0.7 10*3/uL (ref 0.1–1.0)
Monocytes Relative: 9.3 % (ref 3.0–12.0)
Neutro Abs: 4.8 10*3/uL (ref 1.4–7.7)
Neutrophils Relative %: 62.3 % (ref 43.0–77.0)
Platelets: 214 10*3/uL (ref 150.0–400.0)
RBC: 5.4 Mil/uL (ref 4.22–5.81)
RDW: 14.8 % (ref 11.5–15.5)
WBC: 7.7 10*3/uL (ref 4.0–10.5)

## 2023-01-29 LAB — URINALYSIS
Bilirubin Urine: NEGATIVE
Hgb urine dipstick: NEGATIVE
Ketones, ur: NEGATIVE
Leukocytes,Ua: NEGATIVE
Nitrite: NEGATIVE
Specific Gravity, Urine: <=1.005 — AB (ref 1.000–1.030)
Total Protein, Urine: NEGATIVE
Urine Glucose: NEGATIVE
Urobilinogen, UA: 0.2 (ref 0.0–1.0)
pH: 6 (ref 5.0–8.0)

## 2023-01-29 LAB — TSH: TSH: 1.23 u[IU]/mL (ref 0.35–5.50)

## 2023-01-29 LAB — PSA: PSA: 1.09 ng/mL (ref 0.10–4.00)

## 2023-01-29 MED ORDER — ATORVASTATIN CALCIUM 40 MG PO TABS: 40.0000 mg | ORAL_TABLET | Freq: Every day | ORAL | 3 refills | Status: DC

## 2023-01-29 MED ORDER — LOSARTAN POTASSIUM 25 MG PO TABS: 25.0000 mg | ORAL_TABLET | Freq: Every day | ORAL | 3 refills | Status: DC

## 2023-01-29 NOTE — Assessment & Plan Note (Signed)
  Medication - Lipitor 40 mg Nl heart cath  - 25 years ago w/Tom Stuckey Will sch Cardiac CT calcium scoring test

## 2023-01-29 NOTE — Assessment & Plan Note (Addendum)
Start  Losartan 25 mg/d At home: SBP >130 DBP >90

## 2023-01-29 NOTE — Patient Instructions (Addendum)
Cardiac CT calcium scoring test $99    Computed tomography, more commonly known as a CT or CAT scan, is a diagnostic medical imaging test. Like traditional x-rays, it produces multiple images or pictures of the inside of the body. The cross-sectional images generated during a CT scan can be reformatted in multiple planes. They can even generate three-dimensional images. These images can be viewed on a computer monitor. CT images of internal organs, bones, soft tissue and blood vessels provide greater detail than traditional x-rays, particularly of soft tissues and blood vessels. A cardiac CT scan for coronary calcium is a non-invasive way of obtaining information about the presence, location and extent of calcified plaque in the coronary arteries--the vessels that supply oxygen-containing blood to the heart muscle. Calcified plaque results when there is a build-up of fat and other substances under the inner layer of the artery. This material can calcify which signals the presence of atherosclerosis, a disease of the vessel wall, also called coronary artery disease (CAD). People with this disease have an increased risk for heart attacks. In addition, over time, progression of plaque build up (CAD) can narrow the arteries or even close off blood flow to the heart. The result may be chest pain, sometimes called "angina," or a heart attack. Because calcium is a marker of CAD, the amount of calcium detected on a cardiac CT scan is a helpful prognostic tool. The findings on cardiac CT are expressed as a calcium score. Another name for this test is coronary artery calcium scoring.  What are some common uses of the procedure? The goal of cardiac CT scan for calcium scoring is to determine if CAD is present and to what extent, even if there are no symptoms. It is a screening study that may be recommended by a physician for patients with risk factors for CAD but no clinical symptoms. The major risk factors for CAD  are: high blood cholesterol levels  family history of heart attacks  diabetes  high blood pressure  cigarette smoking  overweight or obese  physical inactivity   A negative cardiac CT scan for calcium scoring shows no calcification within the coronary arteries. This suggests that CAD is absent or so minimal it cannot be seen by this technique. The chance of having a heart attack over the next two to five years is very low under these circumstances. A positive test means that CAD is present, regardless of whether or not the patient is experiencing any symptoms. The amount of calcification--expressed as the calcium score--may help to predict the likelihood of a myocardial infarction (heart attack) in the coming years and helps your medical doctor or cardiologist decide whether the patient may need to take preventive medicine or undertake other measures such as diet and exercise to lower the risk for heart attack. The extent of CAD is graded according to your calcium score:  Calcium Score  Presence of CAD (coronary artery disease)  0 No evidence of CAD   1-10 Minimal evidence of CAD  11-100 Mild evidence of CAD  101-400 Moderate evidence of CAD  Over 400 Extensive evidence of CAD   Coronary artery calcium (CAC) score is a strong predictor of incident coronary heart disease (CHD) and provides predictive information beyond traditional risk factors. CAC scoring is reasonable to use in the decision to withhold, postpone, or initiate statin therapy in intermediate-risk or selected borderline-risk asymptomatic adults (age 40-75 years and LDL-C >=70 to <190 mg/dL) who do not have diabetes or established atherosclerotic  cardiovascular disease (ASCVD).* In intermediate-risk (10-year ASCVD risk >=7.5% to <20%) adults or selected borderline-risk (10-year ASCVD risk >=5% to <7.5%) adults in whom a CAC score is measured for the purpose of making a treatment decision the following recommendations have  been made:   If CAC=0, it is reasonable to withhold statin therapy and reassess in 5 to 10 years, as long as higher risk conditions are absent (diabetes mellitus, family history of premature CHD in first degree relatives (males <55 years; females <65 years), cigarette smoking, or LDL >=190 mg/dL).   If CAC is 1 to 99, it is reasonable to initiate statin therapy for patients >=54 years of age.   If CAC is >=100 or >=75th percentile, it is reasonable to initiate statin therapy at any age.   Cardiology referral should be considered for patients with CAC scores >=400 or >=75th percentile.   *2018 AHA/ACC/AACVPR/AAPA/ABC/ACPM/ADA/AGS/APhA/ASPC/NLA/PCNA Guideline on the Management of Blood Cholesterol: A Report of the American College of Cardiology/American Heart Association Task Force on Clinical Practice Guidelines. J Am Coll Cardiol. 2019;73(24):3168-3209.  Use Magnesium oil spray for muscle cramps

## 2023-01-29 NOTE — Assessment & Plan Note (Signed)
We discussed age appropriate health related issues, including available/recomended screening tests and vaccinations. We discussed a need for adhering to healthy diet and exercise. Labs were ordered to be later reviewed . All questions were answered. ?Cardiac CT scan for calcium scoring option offered 1/20, 2/22 ?Colon was due 2022 - pt declined. Will sch Cologuard ?PSA w/Urology ?

## 2023-01-29 NOTE — Progress Notes (Signed)
Subjective:  Patient ID: [name], male    DOB: 1945-07-28  Age: 77 y.o. MRN: [id number]  CC: Annual Exam   HPI [name] presents for a well exam Blood pressure at home -  SBP >130 DBP >90 Follow-up on dyslipidemia    Outpatient Medications Prior to Visit  Medication Sig Dispense Refill   aspirin EC 81 MG tablet Take one tablet by mouth two times a day.     Cholecalciferol (VITAMIN D3) 1000 units CAPS Take by mouth.     furosemide (LASIX) 20 MG tablet Take 1 tablet (20 mg total) by mouth daily. 90 tablet 3   hydrocortisone (ANUSOL-HC) 25 MG suppository Use suppository daily x 3 nights, then change to 1 supp every other night as needed 12 suppository 0   Multiple Vitamin (MULTIVITAMIN) tablet Take 1 tablet by mouth 2 (two) times daily.      tamsulosin (FLOMAX) 0.4 MG CAPS capsule      atorvastatin (LIPITOR) 40 MG tablet Take 1 tablet (40 mg total) by mouth daily. Annual appt due in May must see provider for future refills 90 tablet 0   ALPRAZolam (XANAX) 0.25 MG tablet Take 1-2 tablets (0.25-0.5 mg total) by mouth 2 (two) times daily. (Patient not taking: Reported on 02/13/2022) 60 tablet 1   triazolam (HALCION) 0.25 MG tablet TAKE 1 TABLET BY MOUTH AT BEDTIME IF NEEDED FOR SLEEP. (Patient not taking: Reported on 02/13/2022) 30 tablet 1   No facility-administered medications prior to visit.    ROS: Review of Systems  Constitutional:  Negative for appetite change, fatigue and unexpected weight change.  HENT:  Negative for congestion, nosebleeds, sneezing, sore throat and trouble swallowing.   Eyes:  Negative for itching and visual disturbance.  Respiratory:  Negative for cough.   Cardiovascular:  Negative for chest pain, palpitations and leg swelling.  Gastrointestinal:  Negative for abdominal distention, blood in stool, diarrhea and nausea.  Genitourinary:  Negative for frequency and hematuria.  Musculoskeletal:  Negative for back pain, gait problem, joint swelling and  neck pain.  Skin:  Negative for rash.  Neurological:  Negative for dizziness, tremors, speech difficulty and weakness.  Psychiatric/Behavioral:  Negative for agitation, dysphoric mood and sleep disturbance. The patient is not nervous/anxious.     Objective:  BP 110/70 (BP Location: Left Arm, Patient Position: Sitting, Cuff Size: Large)   Pulse 65   Temp 98.2 F (36.8 C) (Oral)   Ht 5\' 10"  (1.778 m)   Wt 205 lb (93 kg)   SpO2 97%   BMI 29.41 kg/m   BP Readings from Last 3 Encounters:  01/29/23 110/70  06/20/22 135/80  03/27/22 117/78    Wt Readings from Last 3 Encounters:  01/29/23 205 lb (93 kg)  06/20/22 205 lb (93 kg)  03/27/22 206 lb (93.4 kg)    Physical Exam Constitutional:      General: He is not in acute distress.    Appearance: He is well-developed.     Comments: NAD  Eyes:     Conjunctiva/sclera: Conjunctivae normal.     Pupils: Pupils are equal, round, and reactive to light.  Neck:     Thyroid: No thyromegaly.     Vascular: No JVD.  Cardiovascular:     Rate and Rhythm: Normal rate and regular rhythm.     Heart sounds: Normal heart sounds. No murmur heard.    No friction rub. No gallop.  Pulmonary:     Effort: Pulmonary effort is normal. No  respiratory distress.     Breath sounds: Normal breath sounds. No wheezing or rales.  Chest:     Chest wall: No tenderness.  Abdominal:     General: Bowel sounds are normal. There is no distension.     Palpations: Abdomen is soft. There is no mass.     Tenderness: There is no abdominal tenderness. There is no guarding or rebound.  Musculoskeletal:        General: No tenderness. Normal range of motion.     Cervical back: Normal range of motion.  Lymphadenopathy:     Cervical: No cervical adenopathy.  Skin:    General: Skin is warm and dry.     Findings: No rash.  Neurological:     Mental Status: He is alert and oriented to person, place, and time.     Cranial Nerves: No cranial nerve deficit.     Motor: No  abnormal muscle tone.     Coordination: Coordination normal.     Gait: Gait normal.     Deep Tendon Reflexes: Reflexes are normal and symmetric.  Psychiatric:        Behavior: Behavior normal.        Thought Content: Thought content normal.        Judgment: Judgment normal.   Rectal-Per urology  Lab Results  Component Value Date   WBC 7.8 11/22/2021   HGB 14.7 11/22/2021   HCT 44.9 11/22/2021   PLT 208.0 11/22/2021   GLUCOSE 97 11/22/2021   CHOL 131 11/22/2021   TRIG 125.0 11/22/2021   HDL 44.30 11/22/2021   LDLCALC 62 11/22/2021   ALT 19 11/22/2021   AST 18 11/22/2021   NA 138 11/22/2021   K 4.7 11/22/2021   CL 105 11/22/2021   CREATININE 1.08 11/22/2021   BUN 14 11/22/2021   CO2 27 11/22/2021   TSH 1.89 11/22/2021   PSA 1.25 08/18/2020   HGBA1C 6.4 10/03/2014    VAS US CAROTID  Result Date: 08/19/2019 Carotid Arterial Duplex Study Indications:      Bilateral bruits and patient denies any cerebrovascular                   symptoms. Risk Factors:     Hypertension, hyperlipidemia, past history of smoking. Comparison Study: NA Performing Technologist: Tyna Jaksch RVT  Examination Guidelines: A complete evaluation includes B-mode imaging, spectral Doppler, color Doppler, and power Doppler as needed of all accessible portions of each vessel. Bilateral testing is considered an integral part of a complete examination. Limited examinations for reoccurring indications may be performed as noted.  Right Carotid Findings: +----------+--------+--------+--------+------------------+--------+           PSV cm/sEDV cm/sStenosisPlaque DescriptionComments +----------+--------+--------+--------+------------------+--------+ CCA Prox  114     14                                         +----------+--------+--------+--------+------------------+--------+ CCA Distal74      17                                         +----------+--------+--------+--------+------------------+--------+  ICA Prox  47      14                                         +----------+--------+--------+--------+------------------+--------+  ICA Mid   69      28                                         +----------+--------+--------+--------+------------------+--------+ ICA Distal70      25                                         +----------+--------+--------+--------+------------------+--------+ +----------+--------+-------+----------------+-------------------+           PSV cm/sEDV cmsDescribe        Arm Pressure (mmHG) +----------+--------+-------+----------------+-------------------+ [id number]            Multiphasic, [id number]                 +----------+--------+-------+----------------+-------------------+ +---------+--------+--+--------+-+---------+ VertebralPSV cm/s31EDV cm/s8Antegrade +---------+--------+--+--------+-+---------+  Left Carotid Findings: +----------+--------+--------+--------+------------------+------------------+           PSV cm/sEDV cm/sStenosisPlaque DescriptionComments           +----------+--------+--------+--------+------------------+------------------+ CCA Prox  130     20                                                   +----------+--------+--------+--------+------------------+------------------+ CCA Distal70      17                                intimal thickening +----------+--------+--------+--------+------------------+------------------+ ICA Prox  43      13                                                   +----------+--------+--------+--------+------------------+------------------+ ICA Mid   56      20                                                   +----------+--------+--------+--------+------------------+------------------+ ICA Distal50      21                                                   +----------+--------+--------+--------+------------------+------------------+ ECA       102     13                                 intimal thickening +----------+--------+--------+--------+------------------+------------------+ +----------+--------+--------+----------------+-------------------+           PSV cm/sEDV cm/sDescribe        Arm Pressure (mmHG) +----------+--------+--------+----------------+-------------------+ [id number]             Multiphasic, [id number]                 +----------+--------+--------+----------------+-------------------+ +---------+--------+--+--------+--+---------+ VertebralPSV cm/s46EDV cm/s16Antegrade +---------+--------+--+--------+--+---------+   Summary: Right Carotid: There was no evidence of thrombus, dissection, atherosclerotic  plaque or stenosis in the cervical carotid system. Left Carotid: The extracranial vessels were near-normal with only minimal wall               thickening or plaque. Vertebrals:  Bilateral vertebral arteries demonstrate antegrade flow. Subclavians: Normal flow hemodynamics were seen in bilateral subclavian              arteries. *See table(s) above for measurements and observations.  Electronically signed by Lance Muss MD on 08/19/2019 at 9:49:46 AM.    Final     Assessment & Plan:   Problem List Items Addressed This Visit     HYPERCHOLESTEROLEMIA      Medication - Lipitor 40 mg Nl heart cath  - 25 years ago w/Tom Riley Kill Will sch Cardiac CT calcium scoring test      Relevant Medications   atorvastatin (LIPITOR) 40 MG tablet   losartan (COZAAR) 25 MG tablet   Other Relevant Orders   CT CARDIAC SCORING ([facility] LOCATIONS ONLY)   HTN (hypertension)    Start  Losartan 25 mg/d At home: SBP >130 DBP >90      Relevant Medications   atorvastatin (LIPITOR) 40 MG tablet   losartan (COZAAR) 25 MG tablet   Well adult exam - Primary    We discussed age appropriate health related issues, including available/recomended screening tests and vaccinations. We discussed a need for adhering to healthy diet and  exercise. Labs were ordered to be later reviewed . All questions were answered. Cardiac CT scan for calcium scoring option offered 1/20, 2/22 Colon was due 2022 - pt declined. Will sch Cologuard PSA w/Urology      Constipation   Other Visit Diagnoses     Dyslipidemia (high LDL; low HDL)       Relevant Medications   atorvastatin (LIPITOR) 40 MG tablet   Bladder neck obstruction       Dyslipidemia       Relevant Medications   atorvastatin (LIPITOR) 40 MG tablet         Meds ordered this encounter  Medications   atorvastatin (LIPITOR) 40 MG tablet    Sig: Take 1 tablet (40 mg total) by mouth daily.    Dispense:  90 tablet    Refill:  3   losartan (COZAAR) 25 MG tablet    Sig: Take 1 tablet (25 mg total) by mouth daily.    Dispense:  90 tablet    Refill:  3      Follow-up: Return in about 3 months (around 05/01/2023) for a follow-up visit.  Sonda Primes, MD

## 2023-01-31 ENCOUNTER — Other Ambulatory Visit: Payer: Self-pay

## 2023-01-31 DIAGNOSIS — I10 Essential (primary) hypertension: Secondary | ICD-10-CM

## 2023-01-31 MED ORDER — LOSARTAN POTASSIUM 25 MG PO TABS: 25.0000 mg | ORAL_TABLET | Freq: Every day | ORAL | 3 refills | Status: DC

## 2023-02-12 ENCOUNTER — Other Ambulatory Visit: Payer: Self-pay | Admitting: Internal Medicine

## 2023-02-13 ENCOUNTER — Other Ambulatory Visit: Payer: Self-pay | Admitting: Internal Medicine

## 2023-02-18 ENCOUNTER — Ambulatory Visit
Admission: RE | Admit: 2023-02-18 | Discharge: 2023-02-18 | Disposition: A | Discharge status: Home / Self Care | Payer: No Typology Code available for payment source | Source: Ambulatory Visit | Attending: Internal Medicine | Admitting: Internal Medicine

## 2023-02-18 ENCOUNTER — Encounter: Payer: Self-pay | Admitting: Internal Medicine

## 2023-02-18 ENCOUNTER — Other Ambulatory Visit: Payer: Self-pay | Admitting: Internal Medicine

## 2023-02-18 DIAGNOSIS — I7121 Aneurysm of the ascending aorta, without rupture: Secondary | ICD-10-CM

## 2023-02-18 DIAGNOSIS — I712 Thoracic aortic aneurysm, without rupture, unspecified: Secondary | ICD-10-CM | POA: Insufficient documentation

## 2023-02-18 DIAGNOSIS — I2510 Atherosclerotic heart disease of native coronary artery without angina pectoris: Secondary | ICD-10-CM | POA: Insufficient documentation

## 2023-02-18 DIAGNOSIS — E7800 Pure hypercholesterolemia, unspecified: Secondary | ICD-10-CM

## 2023-02-19 ENCOUNTER — Encounter: Payer: Self-pay | Admitting: Internal Medicine

## 2023-02-19 NOTE — Telephone Encounter (Signed)
Patient called back and said that If Dr. Riley Kill will not take him then he wants to try Dr. Tonny Bollman - please let patient know.

## 2023-03-10 ENCOUNTER — Telehealth: Payer: Self-pay | Admitting: Cardiovascular Disease

## 2023-03-10 NOTE — Telephone Encounter (Signed)
Patient states his entire family sees Dr. Excell Seltzer and he has gotten approval from Dr. Excell Seltzer to schedule a new patient appointment with him. Patient states he was advised to have a message sent to MD for scheduling.

## 2023-03-10 NOTE — Telephone Encounter (Signed)
Returned call to patient and got scheduled to see [name] on 03/11/23.

## 2023-03-11 ENCOUNTER — Ambulatory Visit: Payer: BC Managed Care – PPO | Attending: Cardiovascular Disease | Admitting: Cardiovascular Disease

## 2023-03-11 ENCOUNTER — Encounter: Payer: Self-pay | Admitting: Cardiovascular Disease

## 2023-03-11 VITALS — BP 124/70 | HR 73 | Ht 70.0 in | Wt 199.8 lb

## 2023-03-11 DIAGNOSIS — I10 Essential (primary) hypertension: Secondary | ICD-10-CM

## 2023-03-11 DIAGNOSIS — E7800 Pure hypercholesterolemia, unspecified: Secondary | ICD-10-CM

## 2023-03-11 DIAGNOSIS — Z01810 Encounter for preprocedural cardiovascular examination: Secondary | ICD-10-CM

## 2023-03-11 DIAGNOSIS — I2510 Atherosclerotic heart disease of native coronary artery without angina pectoris: Secondary | ICD-10-CM | POA: Diagnosis not present

## 2023-03-11 DIAGNOSIS — I7121 Aneurysm of the ascending aorta, without rupture: Secondary | ICD-10-CM

## 2023-03-11 NOTE — Progress Notes (Signed)
Cardiology Office Note:    Date:  03/11/2023   ID:  [name], DOB 22-Jan-1946, MRN [id number]  PCP:  Tresa Garter, MD   [facility] Providers Cardiologist:  None     Referring MD: Tresa Garter, MD   Chief Complaint  Patient presents with   Coronary Artery Disease    History of Present Illness:    [name] is a 77 y.o. male presenting as a new patient for evaluation of elevated coronary calcium score and dilated ascending aorta.  The patient has been previously followed by Dr. Riley Kill, last seen in 2013.  At that time he was being followed for hypertension, mixed hyperlipidemia, and family history of premature CAD.  He recently had a coronary calcium score done with the following distribution: Left main 0, LAD 288, left circumflex 232, RCA 71, for a total Agatston score of 590 placing him in the 60th percentile for an age, race, and gender matched cohort.  He was also found to have a 4.8 cm ascending thoracic aortic aneurysm.  Recent labs show a creatinine of 1.01, normal LFTs, a cholesterol of 113, HDL 42, and LDL 58.  Blood counts are normal.  The patient is here alone today.  He denies chest pain or pressure.  He does have some exertional dyspnea that he has noticed with any moderate level activities.  He denies orthopnea, PND, heart palpitations, or leg swelling.  He is compliant with his medications.  We reviewed some of his old testing such as a nuclear stress test from 62 as he brought in some of his old records today.  Past Medical History:  Diagnosis Date   Degeneration of lumbar or lumbosacral intervertebral disc    Hemorrhoids    Hypertension    mild   Pure hypercholesterolemia    Unspecified hearing loss     Past Surgical History:  Procedure Laterality Date   TONSILLECTOMY     TRANSURETHRAL RESECTION OF BLADDER TUMOR  1999   benign polyp    Current Medications: Current Meds  Medication Sig   aspirin EC 81 MG tablet Take one  tablet by mouth two times a day.   atorvastatin (LIPITOR) 40 MG tablet Take 1 tablet (40 mg total) by mouth daily.   Cholecalciferol (VITAMIN D3) 1000 units CAPS Take by mouth.   losartan (COZAAR) 25 MG tablet Take 1 tablet (25 mg total) by mouth daily.   Multiple Vitamin (MULTIVITAMIN) tablet Take 1 tablet by mouth 2 (two) times daily.    tamsulosin (FLOMAX) 0.4 MG CAPS capsule Take 1 capsule (0.4 mg total) by mouth in the morning and at bedtime.   triazolam (HALCION) 0.25 MG tablet TAKE 1 TABLET BY MOUTH AT BEDTIME IF NEEDED FOR SLEEP.   [DISCONTINUED] ALPRAZolam (XANAX) 0.25 MG tablet Take 1-2 tablets (0.25-0.5 mg total) by mouth 2 (two) times daily.     Allergies:   Patient has no known allergies.   Social History   Socioeconomic History   Marital status: Married    Spouse name: Not on file   Number of children: 2   Years of education: Not on file   Highest education level: Not on file  Occupational History   Occupation: Paramedic: Production assistant, radio FOR SELF EMPLOYED  Tobacco Use   Smoking status: Former    Types: Cigarettes   Smokeless tobacco: Never  Vaping Use   Vaping status: Never Used  Substance and Sexual Activity   Alcohol  use: No   Drug use: No   Sexual activity: Yes    Partners: Female  Other Topics Concern   Not on file  Social History Narrative   [employer], [place]-undergrad. Married- 1987. 2 sons-'91 [employer], '94-headed for [employer]. 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   Social Determinants of Corporate investment banker Strain: Not on file  Food Insecurity: Not on file  Transportation Needs: Not on file  Physical Activity: Not on file  Stress: Not on file  Social Connections: Not on file     Family History: The patient's family history includes Colon cancer (age of onset: 74) in his cousin; Diabetes in his cousin and maternal grandmother; Heart attack in his mother; Heart attack (age of onset: 51) in  his brother; Heart attack (age of onset: 76) in his father; Heart disease in his brother, father, and mother; Hyperlipidemia in his mother and another family member; Hypertension in an other family member. There is no history of Lung cancer, Prostate cancer, or Cancer.  ROS:   Please see the history of present illness.    All other systems reviewed and are negative.  EKGs/Labs/Other Studies Reviewed:    The following studies were reviewed today: Coronary CT calcium score as outlined above With a maximum diameter 4.8 cm.  EKG Interpretation Date/Time:  Tuesday March 11 2023 14:10:27 EDT Ventricular Rate:  73 PR Interval:  188 QRS Duration:  108 QT Interval:  376 QTC Calculation: 414 R Axis:   -66  Text Interpretation: Normal sinus rhythm Left anterior fascicular block When compared with ECG of 31-Oct-2006 15:47, No significant change was found Confirmed by Tonny Bollman [id number]) on 03/11/2023 2:16:54 PM    Recent Labs: 01/29/2023: ALT 20; BUN 15; Creatinine, Ser 1.01; Hemoglobin 15.0; Platelets 214.0; Potassium 4.0; Sodium 135; TSH 1.23  Recent Lipid Panel    Component Value Date/Time   CHOL 113 01/29/2023 1539   TRIG 71.0 01/29/2023 1539   HDL 41.50 01/29/2023 1539   CHOLHDL 3 01/29/2023 1539   VLDL 14.2 01/29/2023 1539   LDLCALC 58 01/29/2023 1539     Risk Assessment/Calculations:                Physical Exam:    VS:  BP 124/70   Pulse 73   Ht 5\' 10"  (1.778 m)   Wt 199 lb 12.8 oz (90.6 kg)   SpO2 98%   BMI 28.67 kg/m     Wt Readings from Last 3 Encounters:  03/11/23 199 lb 12.8 oz (90.6 kg)  01/29/23 205 lb (93 kg)  06/20/22 205 lb (93 kg)     GEN:  Well nourished, well developed in no acute distress HEENT: Normal NECK: No JVD; No carotid bruits LYMPHATICS: No lymphadenopathy CARDIAC: RRR, no murmurs, rubs, gallops RESPIRATORY:  Clear to auscultation without rales, wheezing or rhonchi  ABDOMEN: Soft, non-tender, non-distended MUSCULOSKELETAL:  No  edema; No deformity  SKIN: Warm and dry NEUROLOGIC:  Alert and oriented x 3 PSYCHIATRIC:  Normal affect   ASSESSMENT:    1. Primary hypertension   2. HYPERCHOLESTEROLEMIA   3. Aneurysm of ascending aorta without rupture (HCC)   4. Atherosclerosis of native coronary artery of native heart without angina pectoris   5. Pre-procedural cardiovascular examination    PLAN:    In order of problems listed above:  Blood pressure is well-controlled on losartan.  Will continue.  Recent labs with a creatinine of 1.01 and potassium of 4.0. The patient's lipids are  well treated with atorvastatin 40 mg daily.  Cholesterol is 113, LDL 58, and HDL 42. The patient has been diagnosed with an ascending aortic aneurysm.  I think the patient should have a more accurate evaluation which can be provided with a gated coronary CTA.  Will determine appropriate follow-up and necessary evaluation once the study is completed.  He has no family history of aneurysmal disease or aortic dissection. The patient has three-vessel coronary calcification and exertional dyspnea which could be an anginal equivalent.  I have recommended a gated coronary CTA for further evaluation.  If he has too much calcium, could consider another stress testing modality.  Hopefully we can determine aortic valve morphology on the study as well as this will be pertinent in managing his ascending aortic aneurysm.           Medication Adjustments/Labs and Tests Ordered: Current medicines are reviewed at length with the patient today.  Concerns regarding medicines are outlined above.  Orders Placed This Encounter  Procedures   CT ANGIO CHEST AORTA W/ & OR WO/CM & GATING ([facility] ONLY)   Basic metabolic panel   EKG 12-Lead   No orders of the defined types were placed in this encounter.   Patient Instructions  Medication Instructions:  Your physician recommends that you continue on your current medications as directed. Please refer to  the Current Medication list given to you today.  *If you need a refill on your cardiac medications before your next appointment, please call your pharmacy*   Lab Work: BMET If you have labs (blood work) drawn today and your tests are completely normal, you will receive your results only by: MyChart Message (if you have MyChart) OR A paper copy in the mail If you have any lab test that is abnormal or we need to change your treatment, we will call you to review the results.   Testing/Procedures: Gated Coronary CT Angiogram (prior to next visit) Non-Cardiac CT Angiography (CTA), is a special type of CT scan that uses a computer to produce multi-dimensional views of major blood vessels throughout the body. In CT angiography, a contrast material is injected through an IV to help visualize the blood vessels  Follow-Up: At [facility], you and your health needs are our priority.  As part of our continuing mission to provide you with exceptional heart care, we have created designated Provider Care Teams.  These Care Teams include your primary Cardiologist (physician) and Advanced Practice Providers (APPs -  Physician Assistants and Nurse Practitioners) who all work together to provide you with the care you need, when you need it.  Your next appointment:   1 year(s)  Provider:   Tonny Bollman, MD     Signed, Tonny Bollman, MD  03/11/2023 4:56 PM    [facility]

## 2023-03-11 NOTE — Patient Instructions (Signed)
Medication Instructions:  Your physician recommends that you continue on your current medications as directed. Please refer to the Current Medication list given to you today.  *If you need a refill on your cardiac medications before your next appointment, please call your pharmacy*   Lab Work: BMET If you have labs (blood work) drawn today and your tests are completely normal, you will receive your results only by: MyChart Message (if you have MyChart) OR A paper copy in the mail If you have any lab test that is abnormal or we need to change your treatment, we will call you to review the results.   Testing/Procedures: Gated Coronary CT Angiogram (prior to next visit) Non-Cardiac CT Angiography (CTA), is a special type of CT scan that uses a computer to produce multi-dimensional views of major blood vessels throughout the body. In CT angiography, a contrast material is injected through an IV to help visualize the blood vessels  Follow-Up: At [facility], you and your health needs are our priority.  As part of our continuing mission to provide you with exceptional heart care, we have created designated Provider Care Teams.  These Care Teams include your primary Cardiologist (physician) and Advanced Practice Providers (APPs -  Physician Assistants and Nurse Practitioners) who all work together to provide you with the care you need, when you need it.  Your next appointment:   1 year(s)  Provider:   Tonny Bollman, MD

## 2023-03-17 NOTE — Progress Notes (Signed)
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 No recurrence since then.  He also has a h/o BPH w/ LUTS. Estimated prostate volume 77 mL. He is on tamsulosin twice daily  Thus far he has been seen in [place] @ [facility].  IPSS 20/3.  He is experiencing no dizziness.  He has done a fair amount of research regarding intervention to get him off of medication.  He is concerned about retrograde ejaculation.  He has had no blood in his urine.    Past Medical History:  Diagnosis Date   Degeneration of lumbar or lumbosacral intervertebral disc    Hemorrhoids    Hypertension    mild   Pure hypercholesterolemia    Unspecified hearing loss     Past Surgical History:  Procedure Laterality Date   TONSILLECTOMY     TRANSURETHRAL RESECTION OF BLADDER TUMOR  1999   benign polyp    Home Medications:  Allergies as of 03/18/2023   No Known Allergies      Medication List        Accurate as of March 17, 2023  3:45 PM. If you have any questions, ask your nurse or doctor.          aspirin EC 81 MG tablet Take one tablet by mouth two times a day.   atorvastatin 40 MG tablet Commonly known as: LIPITOR Take 1 tablet (40 mg total) by mouth daily.   losartan 25 MG tablet Commonly known as: COZAAR Take 1 tablet (25 mg total) by mouth daily.   multivitamin tablet Take 1 tablet by mouth 2 (two) times daily.   tamsulosin 0.4 MG Caps capsule Commonly known as: FLOMAX Take 1 capsule (0.4 mg total) by mouth in the morning and at bedtime.   triazolam 0.25 MG tablet Commonly known as: HALCION TAKE 1 TABLET BY MOUTH AT BEDTIME IF NEEDED FOR SLEEP.   Vitamin D3 25 MCG (1000 UT) Caps Take by mouth.        Allergies: No Known Allergies  Family History  Problem Relation Age of Onset   Heart attack Mother        age 105   Heart disease Mother         CHF   Hyperlipidemia Mother    Heart attack Father 55       died of cardiovascular diease   Heart disease Father        CAD/MI   Heart attack Brother 38       S/P CABG @ 29   Heart disease Brother        CAD/MI@21 , CABG @ 69   Diabetes Maternal Grandmother    Colon cancer Cousin 72   Diabetes Cousin    Hyperlipidemia Other    Hypertension Other    Lung cancer Neg Hx    Prostate cancer Neg Hx    Cancer Neg Hx     Social History:  reports that he has quit smoking. His smoking use included cigarettes. He has never used smokeless tobacco. He reports that he does not drink alcohol and does not use drugs.  ROS: A complete review of systems was performed.  All systems are negative except for pertinent findings as noted.  Physical Exam:  Vital signs in last 24 hours: There were no vitals taken for this visit. Constitutional:  Alert  and oriented, No acute distress Cardiovascular: Regular rate  Respiratory: Normal respiratory effort GI: Abdomen is soft, nontender, nondistended, no abdominal masses. No CVAT.  Genitourinary: Normal male phallus, testes are descended bilaterally and non-tender and without masses, scrotum is normal in appearance without lesions or masses, perineum is normal on inspection. Lymphatic: No lymphadenopathy Neurologic: Grossly intact, no focal deficits Psychiatric: Normal mood and affect  I have reviewed prior pt notes  I have reviewed notes from referring/previous physicians--[facility] notes  I have reviewed urinalysis results  I have independently reviewed  IPSS form  I have reviewed prior PSA results--most recently 1.09  Bladder scan volume 300 mL     Impression/Assessment:  1.  Remote history of low grade bladder cancer, no evidence recurrence  2.  BPH with symptoms with elevated residual urine volume  Plan:  I spent a lengthy amount of time with [name] talking about intervention.  I would favor HoLEP over other interventions.  He is in  agreement with this.  I will send him to see Dr. Richardo Hanks in [place] for this

## 2023-03-18 ENCOUNTER — Ambulatory Visit (INDEPENDENT_AMBULATORY_CARE_PROVIDER_SITE_OTHER): Payer: BC Managed Care – PPO | Admitting: Urology

## 2023-03-18 ENCOUNTER — Encounter: Payer: Self-pay | Admitting: Urology

## 2023-03-18 VITALS — BP 137/85 | HR 67

## 2023-03-18 DIAGNOSIS — N401 Enlarged prostate with lower urinary tract symptoms: Secondary | ICD-10-CM

## 2023-03-18 DIAGNOSIS — N138 Other obstructive and reflux uropathy: Secondary | ICD-10-CM

## 2023-03-18 DIAGNOSIS — Z8551 Personal history of malignant neoplasm of bladder: Secondary | ICD-10-CM

## 2023-03-18 LAB — URINALYSIS, ROUTINE W REFLEX MICROSCOPIC
Bilirubin, UA: NEGATIVE
Glucose, UA: NEGATIVE
Ketones, UA: NEGATIVE
Nitrite, UA: NEGATIVE
Protein,UA: NEGATIVE
RBC, UA: NEGATIVE
Specific Gravity, UA: 1.01 (ref 1.005–1.030)
Urobilinogen, Ur: 0.2 mg/dL (ref 0.2–1.0)
pH, UA: 6.5 (ref 5.0–7.5)

## 2023-03-18 LAB — MICROSCOPIC EXAMINATION
Bacteria, UA: NONE SEEN
RBC, Urine: NONE SEEN /HPF (ref 0–2)

## 2023-03-18 LAB — BLADDER SCAN AMB NON-IMAGING: Scan Result: >307

## 2023-04-08 ENCOUNTER — Ambulatory Visit: Payer: BC Managed Care – PPO | Admitting: Urology

## 2023-04-08 ENCOUNTER — Encounter: Payer: Self-pay | Admitting: Urology

## 2023-04-08 VITALS — BP 156/99 | HR 62 | Ht 70.0 in | Wt 196.0 lb

## 2023-04-08 DIAGNOSIS — N401 Enlarged prostate with lower urinary tract symptoms: Secondary | ICD-10-CM | POA: Diagnosis not present

## 2023-04-08 DIAGNOSIS — N138 Other obstructive and reflux uropathy: Secondary | ICD-10-CM

## 2023-04-08 DIAGNOSIS — R339 Retention of urine, unspecified: Secondary | ICD-10-CM | POA: Diagnosis not present

## 2023-04-08 LAB — BLADDER SCAN AMB NON-IMAGING

## 2023-04-08 NOTE — Patient Instructions (Signed)
Holmium Laser Enucleation of the Prostate (HoLEP)  HoLEP is a treatment for men with benign prostatic hyperplasia (BPH). The laser surgery removed blockages of urine flow, and is done without any incisions on the body.     What is HoLEP?  HoLEP is a type of laser surgery used to treat obstruction (blockage) of urine flow as a result of benign prostatic hyperplasia (BPH). In men with BPH, the prostate gland is not cancerous, but has become enlarged. An enlarged prostate can result in a number of urinary tract symptoms such as weak urinary stream, difficulty in starting urination, inability to urinate, frequent urination, or getting up at night to urinate.  HoLEP was developed in the 1990's as a more effective and less expensive surgical option for BPH, compared to other surgical options such as laser vaporization(PVP/greenlight laser), transurethral resection of the prostate(TURP), and open simple prostatectomy.   What happens during a HoLEP?  HoLEP requires general anesthesia ("asleep" throughout the procedure).   An antibiotic is given to reduce the risk of infection  A surgical instrument called a resectoscope is inserted through the urethra (the tube that carries urine from the bladder). The resectoscope has a camera that allows the surgeon to view the internal structure of the prostate gland, and to see where the incisions are being made during surgery.  The laser is inserted into the resectoscope and is used to enucleate (free up) the enlarged prostate tissue from the capsule (outer shell) and then to seal up any blood vessels. The tissue that has been removed is pushed back into the bladder.  A morcellator is placed through the resectoscope, and is used to suction out the prostate tissue that has been pushed into the bladder.  When the prostate tissue has been removed, the resectoscope is removed, and a foley catheter is placed to allow healing and drain the urine from the  bladder.     What happens after a HoLEP?  More than 90% of patients go home the same day a few hours after surgery. Less than 10% will be admitted to the hospital overnight for observation to monitor the urine, or if they have other medical problems.  Fluid is flushed through the catheter for about 1 hour after surgery to clear any blood from the urine. It is normal to have some blood in the urine after surgery. The need for blood transfusion is extremely rare.  Eating and drinking are permitted after the procedure once the patient has fully awakened from anesthesia.  The catheter is usually removed 2-3 days after surgery- the patient will come to clinic to have the catheter removed and make sure they can urinate on their own.  It is very important to drink lots of fluids after surgery for one week to keep the bladder flushed.  At first, there may be some burning with urination, but this typically improved within a few hours to days. Most patients do not have a significant amount of pain, and narcotic pain medications are rarely needed.  Symptoms of urinary frequency, urgency, and even leakage are NORMAL for the first few weeks after surgery as the bladder adjusts after having to work hard against blockage from the prostate for many years. This will improve, but can sometimes take several months.  The use of pelvic floor exercises (Kegel exercises) can help improve problems with urinary incontinence.   After catheter removal, patients will be seen at 6 weeks and 6 months for symptom check  No heavy lifting for  at least 2-3 weeks after surgery, however patients can walk and do light activities the first day after surgery. Return to work time depends on occupation.    What are the advantages of HoLEP?  HoLEP has been studied in many different parts of the world and has been shown to be a safe and effective procedure. Although there are many types of BPH surgeries available, HoLEP offers a  unique advantage in being able to remove a large amount of tissue without any incisions on the body, even in very large prostates, while decreasing the risk of bleeding and providing tissue for pathology (to look for cancer). This decreases the need for blood transfusions during surgery, minimizes hospital stay, and reduces the risk of needing repeat treatment.  What are the side effects of HoLEP?  Temporary burning and bleeding during urination. Some blood may be seen in the urine for weeks after surgery and is part of the healing process.  Urinary incontinence (inability to control urine flow) is expected in all patients immediately after surgery and they should wear pads for the first few days/weeks. This typically improves over the course of several weeks. Performing Kegel exercises can help decrease leakage from stress maneuvers such as coughing, sneezing, or lifting. The rate of long term leakage is very low. Patients may also have leakage with urgency and this may be treated with medication. The risk of urge incontinence can be dependent on several factors including age, prostate size, symptoms, and other medical problems.  Retrograde ejaculation or "backwards ejaculation." In 90% of cases, the patient will not see any fluid during ejaculation after surgery.  Erectile function is generally not significantly affected.   What are the risks of HoLEP?  Injury to the urethra or development of scar tissue at a later date  Injury to the capsule of the prostate (typically treated with longer catheterization).  Injury to the bladder or ureteral orifices (where the urine from the kidney drains out)  Infection of the bladder, testes, or kidneys  Return of urinary obstruction at a later date requiring another operation (<2%)  Need for blood transfusion or re-operation due to bleeding  Failure to relieve all symptoms and/or need for prolonged catheterization after surgery  5-15% of patients are  found to have previously undiagnosed prostate cancer in their specimen. Prostate cancer can be treated after HoLEP.  Standard risks of anesthesia including blood clots, heart attacks, etc  When should I call my doctor?  Fever over 101.3 degrees  Inability to urinate, or large blood clots in the urine    Prostatic Urethral Lift (urolift)  Prostatic urethral lift is a surgical procedure to treat symptoms of prostate gland enlargement that occurs with age (benign prostatic hypertrophy, BPH). The urethra passes between the two lobes of the prostate. The urethra is the part of the body that drains urine from the bladder. As the prostate enlarges, it can push on the urethra and cause problems with urinating. This procedure involves placing an implant that holds the prostate away from the urethra. The procedure is done using a thin device called a cystoscope. The device is inserted through the tip of the penis and moved up the urethra to the prostate. This is less invasive than other procedures that require an incision. You may have this procedure if: You have symptoms of BPH. Your prostate is not severely enlarged. Medicines to treat BPH are not working or not tolerated. You want to avoid possible sexual side effects from medicines or other  procedures that are used to treat BPH. Tell a health care provider about: Any allergies you have. All medicines you are taking, including vitamins, herbs, eye drops, creams, and over-the-counter medicines. Any problems you or family members have had with anesthetic medicines. Any bleeding problems you have. Any surgeries you have had. Any medical conditions you have. What are the risks? Generally, this is a safe procedure. However, problems may occur, including: Bleeding. Infection. Leaking of urine (incontinence). Allergic reactions to medicines. Return of BPH symptoms after 2 years, requiring more treatment. What happens before the procedure? When  to stop eating and drinking Follow instructions from your health care provider about what you may eat and drink before your procedure. These may include: 8 hours before your procedure Stop eating most foods. Do not eat meat, fried foods, or fatty foods. Eat only light foods, such as toast or crackers. All liquids are okay except energy drinks and alcohol. 6 hours before your procedure Stop eating. Drink only clear liquids, such as water, clear fruit juice, black coffee, plain tea, and sports drinks. Do not drink energy drinks or alcohol. 2 hours before your procedure Stop drinking all liquids. You may be allowed to take medicines with small sips of water. If you do not follow your health care provider's instructions, your procedure may be delayed or canceled. Medicines Ask your health care provider about: Changing or stopping your regular medicines. This is especially important if you are taking diabetes medicines or blood thinners. Taking medicines such as aspirin and ibuprofen. These medicines can thin your blood. Do not take these medicines unless your health care provider tells you to take them. Taking over-the-counter medicines, vitamins, herbs, and supplements. Surgery safety Ask your health care provider what steps will be taken to help prevent infection. These steps may include: Removing hair at the surgery site. Washing skin with a germ-killing soap. Taking antibiotic medicine. General instructions Do not use any products that contain nicotine or tobacco for at least 4 weeks before the procedure. These products include cigarettes, chewing tobacco, and vaping devices, such as e-cigarettes. If you need help quitting, ask your health care provider. If you will be going home right after the procedure, plan to have a responsible adult: Take you home from the hospital or clinic. You will not be allowed to drive. Care for you for the time you are told. What happens during the  procedure? An IV may be inserted into one of your veins. You will be given one or more of the following: A medicine to help you relax (sedative). A medicine that is injected into your urethra to numb the area (local anesthetic). A medicine to make you fall asleep (general anesthetic). A cystoscope will be inserted into your penis and moved through your urethra to your prostate. A device will be inserted through the cystoscope and used to press the lobes of your prostate away from your urethra. Implants will be inserted through the device to hold the lobes of your prostate in the widened position. The device and cystoscope will be removed. The procedure may vary among health care providers and hospitals. What happens after the procedure? Your blood pressure, heart rate, breathing rate, and blood oxygen level be monitored until you leave the hospital or clinic. If you were given a sedative during the procedure, it can affect you for several hours. Do not drive or operate machinery until your health care provider says that it is safe. Summary Prostatic urethral lift is a surgical procedure  to relieve symptoms of prostate gland enlargement that occurs with age (benign prostatic hypertrophy, BPH). The procedure is performed with a thin device called a cystoscope. This device is inserted through the tip of the penis and moved up the urethra to reach the prostate. This is less invasive than other procedures that require an incision. If you will be going home right after the procedure, plan to have a responsible adult take you home from the hospital or clinic. You will not be allowed to drive. This information is not intended to replace advice given to you by your health care provider. Make sure you discuss any questions you have with your health care provider. Document Revised: 01/26/2021 Document Reviewed: 01/26/2021 Elsevier Patient Education  2024 [employer].

## 2023-04-08 NOTE — Progress Notes (Signed)
04/08/23 11:38 AM   [name] 25-Dec-1945 [id number]  CC: BPH and incomplete bladder emptying, history of low-grade bladder cancer  HPI: 77 year old male who was previously followed by Dr. Retta Diones and referred to discuss HOLEP.  He has a history of low-grade bladder cancer with TURBT in 1998, no recurrence since that time, he thinks it has been at least a few years since he had a cystoscopy.  Estimated prostate volume from Dr. Retta Diones was 77 g, unclear if he has had any prostate imaging within our system.  He has documented incomplete emptying with a PVR of 300 mL with Dr. Retta Diones, as well as today, primary urinary symptoms are weak stream, sensation of incomplete emptying, and nocturia 4 times overnight, IPSS score today is 24 with quality-of-life unhappy.  He is interested in outlet procedures, however is unwilling to accept any intervention that may cause retrograde ejaculation.  PSA was normal at 1.0.  He has been on Flomax that improved his symptoms for a few years but symptoms have continued to worsen.  PMH: Past Medical History:  Diagnosis Date   Degeneration of lumbar or lumbosacral intervertebral disc    Hemorrhoids    Hypertension    mild   Pure hypercholesterolemia    Unspecified hearing loss     Surgical History: Past Surgical History:  Procedure Laterality Date   TONSILLECTOMY     TRANSURETHRAL RESECTION OF BLADDER TUMOR  1999   benign polyp    Family History: Family History  Problem Relation Age of Onset   Heart attack Mother        age 4   Heart disease Mother        CHF   Hyperlipidemia Mother    Heart attack Father 99       died of cardiovascular diease   Heart disease Father        CAD/MI   Heart attack Brother 50       S/P CABG @ 28   Heart disease Brother        CAD/MI@21 , CABG @ 17   Diabetes Maternal Grandmother    Colon cancer Cousin 72   Diabetes Cousin    Hyperlipidemia Other    Hypertension Other    Lung cancer Neg Hx     Prostate cancer Neg Hx    Cancer Neg Hx     Social History:  reports that he has quit smoking. His smoking use included cigarettes. He has been exposed to tobacco smoke. He has never used smokeless tobacco. He reports that he does not drink alcohol and does not use drugs.  Physical Exam: BP (!) 156/99   Pulse 62   Ht 5\' 10"  (1.778 m)   Wt 196 lb (88.9 kg)   BMI 28.12 kg/m    Constitutional:  Alert and oriented, No acute distress. Cardiovascular: No clubbing, cyanosis, or edema. Respiratory: Normal respiratory effort, no increased work of breathing. GI: Abdomen is soft, nontender, nondistended, no abdominal masses  Assessment & Plan:   77 year old male with BPH and incomplete bladder emptying and bothersome urinary symptoms, interested in outlet per surgeries but would like to preserve ejaculatory function.  Has not had a recent cystoscopy or prostate sizing.  We reviewed the different BPH procedures and their impact on ejaculatory function.  We discussed the importance of cystoscopy and prostate sizing prior to determining a treatment option.  We discussed the risks and benefits of HoLEP at length.  The procedure requires general anesthesia and takes  1 to 2 hours, and a holmium laser is used to enucleate the prostate and push this tissue into the bladder.  A morcellator is then used to remove this tissue, which is sent for pathology.  The vast majority(>95%) of patients are able to discharge the same day with a catheter in place for 2 to 3 days, and will follow-up in clinic for a voiding trial.  We specifically discussed the risks of bleeding, infection, retrograde ejaculation, temporary urgency and urge incontinence, very low risk of long-term incontinence, urethral stricture/bladder neck contracture, pathologic evaluation of prostate tissue and possible detection of prostate cancer or other malignancy, and possible need for additional procedures.  Follow-up for cystoscopy/TRUS, if  large middle lobe could consider median lobe enucleation to preserve ejaculatory function  Legrand Rams, MD 04/08/2023  [facility] [address], [address] [place], [place] [address] [phone]

## 2023-04-10 ENCOUNTER — Ambulatory Visit (HOSPITAL_COMMUNITY): Admission: RE | Admit: 2023-04-10 | Payer: BC Managed Care – PPO | Source: Ambulatory Visit

## 2023-04-17 ENCOUNTER — Ambulatory Visit (HOSPITAL_COMMUNITY)
Admission: RE | Admit: 2023-04-17 | Discharge: 2023-04-17 | Disposition: A | Discharge status: Home / Self Care | Payer: BC Managed Care – PPO | Source: Ambulatory Visit | Attending: Cardiovascular Disease | Admitting: Cardiovascular Disease

## 2023-04-17 DIAGNOSIS — Z01810 Encounter for preprocedural cardiovascular examination: Secondary | ICD-10-CM | POA: Diagnosis present

## 2023-04-17 DIAGNOSIS — I10 Essential (primary) hypertension: Secondary | ICD-10-CM | POA: Insufficient documentation

## 2023-04-17 DIAGNOSIS — I7121 Aneurysm of the ascending aorta, without rupture: Secondary | ICD-10-CM | POA: Diagnosis present

## 2023-04-17 DIAGNOSIS — E7800 Pure hypercholesterolemia, unspecified: Secondary | ICD-10-CM | POA: Diagnosis present

## 2023-04-17 DIAGNOSIS — I2510 Atherosclerotic heart disease of native coronary artery without angina pectoris: Secondary | ICD-10-CM | POA: Insufficient documentation

## 2023-04-17 MED ORDER — IOHEXOL 350 MG/ML SOLN
75.0000 mL | Freq: Once | INTRAVENOUS | Status: AC | PRN
  Administered 2023-04-17: 75 mL via INTRAVENOUS

## 2023-04-24 ENCOUNTER — Telehealth: Payer: Self-pay

## 2023-04-24 ENCOUNTER — Other Ambulatory Visit: Payer: BC Managed Care – PPO

## 2023-04-24 ENCOUNTER — Ambulatory Visit: Payer: Medicare Other | Attending: Cardiovascular Disease

## 2023-04-24 DIAGNOSIS — I7121 Aneurysm of the ascending aorta, without rupture: Secondary | ICD-10-CM

## 2023-04-24 DIAGNOSIS — I2510 Atherosclerotic heart disease of native coronary artery without angina pectoris: Secondary | ICD-10-CM

## 2023-04-24 DIAGNOSIS — Z01810 Encounter for preprocedural cardiovascular examination: Secondary | ICD-10-CM

## 2023-04-24 DIAGNOSIS — I10 Essential (primary) hypertension: Secondary | ICD-10-CM

## 2023-04-24 DIAGNOSIS — E7800 Pure hypercholesterolemia, unspecified: Secondary | ICD-10-CM

## 2023-04-24 LAB — BASIC METABOLIC PANEL
BUN/Creatinine Ratio: 19 (ref 10–24)
BUN: 18 mg/dL (ref 8–27)
CO2: 24 mmol/L (ref 20–29)
Calcium: 9.1 mg/dL (ref 8.6–10.2)
Chloride: 104 mmol/L (ref 96–106)
Creatinine, Ser: 0.96 mg/dL (ref 0.76–1.27)
Glucose: 109 mg/dL — ABNORMAL HIGH (ref 70–99)
Potassium: 4.6 mmol/L (ref 3.5–5.2)
Sodium: 140 mmol/L (ref 134–144)
eGFR: 80 mL/min/{1.73_m2} (ref >59)

## 2023-04-24 NOTE — Telephone Encounter (Signed)
-----   Message from [name] sent at 04/23/2023 11:27 AM EDT ----- With a gated CTA, the ascending aortic aneurysm is smaller than that previously demonstrated on an on gated coronary calcium score scan.  Recommend 1 year follow-up CTA of the chest to reevaluate his aneurysm as well as the small pulmonary nodules.

## 2023-04-24 NOTE — Telephone Encounter (Signed)
Called and spoke with patient who verbalized understanding of CTA results and need to repeat test next year for surveillance. Order placed at this time.

## 2023-05-19 ENCOUNTER — Ambulatory Visit: Payer: Self-pay | Admitting: Cardiovascular Disease

## 2023-05-22 ENCOUNTER — Other Ambulatory Visit: Payer: BC Managed Care – PPO | Admitting: Urology

## 2023-05-25 ENCOUNTER — Other Ambulatory Visit: Payer: Self-pay | Admitting: Internal Medicine

## 2023-07-28 DIAGNOSIS — R202 Paresthesia of skin: Secondary | ICD-10-CM | POA: Diagnosis not present

## 2023-07-28 DIAGNOSIS — L821 Other seborrheic keratosis: Secondary | ICD-10-CM | POA: Diagnosis not present

## 2023-07-28 DIAGNOSIS — L814 Other melanin hyperpigmentation: Secondary | ICD-10-CM | POA: Diagnosis not present

## 2023-07-28 DIAGNOSIS — D225 Melanocytic nevi of trunk: Secondary | ICD-10-CM | POA: Diagnosis not present

## 2023-09-17 DIAGNOSIS — K080 Exfoliation of teeth due to systemic causes: Secondary | ICD-10-CM | POA: Diagnosis not present

## 2023-10-01 DIAGNOSIS — R3914 Feeling of incomplete bladder emptying: Secondary | ICD-10-CM | POA: Diagnosis not present

## 2023-10-01 DIAGNOSIS — N401 Enlarged prostate with lower urinary tract symptoms: Secondary | ICD-10-CM | POA: Diagnosis not present

## 2024-01-13 ENCOUNTER — Other Ambulatory Visit: Payer: Self-pay | Admitting: Internal Medicine

## 2024-01-13 DIAGNOSIS — I10 Essential (primary) hypertension: Secondary | ICD-10-CM

## 2024-02-19 ENCOUNTER — Ambulatory Visit: Admitting: Internal Medicine

## 2024-02-19 ENCOUNTER — Other Ambulatory Visit

## 2024-02-19 ENCOUNTER — Encounter: Payer: Self-pay | Admitting: Internal Medicine

## 2024-02-19 VITALS — BP 138/80 | HR 69 | Temp 98.2°F | Ht 70.0 in | Wt 192.0 lb

## 2024-02-19 DIAGNOSIS — F4321 Adjustment disorder with depressed mood: Secondary | ICD-10-CM | POA: Diagnosis not present

## 2024-02-19 DIAGNOSIS — N401 Enlarged prostate with lower urinary tract symptoms: Secondary | ICD-10-CM

## 2024-02-19 DIAGNOSIS — I10 Essential (primary) hypertension: Secondary | ICD-10-CM

## 2024-02-19 DIAGNOSIS — Z0000 Encounter for general adult medical examination without abnormal findings: Secondary | ICD-10-CM | POA: Diagnosis not present

## 2024-02-19 DIAGNOSIS — E559 Vitamin D deficiency, unspecified: Secondary | ICD-10-CM | POA: Diagnosis not present

## 2024-02-19 DIAGNOSIS — Z13220 Encounter for screening for lipoid disorders: Secondary | ICD-10-CM | POA: Diagnosis not present

## 2024-02-19 DIAGNOSIS — R350 Frequency of micturition: Secondary | ICD-10-CM

## 2024-02-19 DIAGNOSIS — E782 Mixed hyperlipidemia: Secondary | ICD-10-CM | POA: Insufficient documentation

## 2024-02-19 LAB — CBC WITH DIFFERENTIAL/PLATELET
Basophils Absolute: 0 K/uL (ref 0.0–0.1)
Basophils Relative: 0.5 % (ref 0.0–3.0)
Eosinophils Absolute: 0.2 K/uL (ref 0.0–0.7)
Eosinophils Relative: 2.5 % (ref 0.0–5.0)
HCT: 46 % (ref 39.0–52.0)
Hemoglobin: 14.9 g/dL (ref 13.0–17.0)
Lymphocytes Relative: 28 % (ref 12.0–46.0)
Lymphs Abs: 2.2 K/uL (ref 0.7–4.0)
MCHC: 32.4 g/dL (ref 30.0–36.0)
MCV: 85.8 fl (ref 78.0–100.0)
Monocytes Absolute: 0.8 K/uL (ref 0.1–1.0)
Monocytes Relative: 9.9 % (ref 3.0–12.0)
Neutro Abs: 4.6 K/uL (ref 1.4–7.7)
Neutrophils Relative %: 59.1 % (ref 43.0–77.0)
Platelets: 216 K/uL (ref 150.0–400.0)
RBC: 5.37 Mil/uL (ref 4.22–5.81)
RDW: 15 % (ref 11.5–15.5)
WBC: 7.8 K/uL (ref 4.0–10.5)

## 2024-02-19 LAB — PSA: PSA: 1.11 ng/mL (ref 0.10–4.00)

## 2024-02-19 LAB — URINALYSIS
Bilirubin Urine: NEGATIVE
Hgb urine dipstick: NEGATIVE
Ketones, ur: NEGATIVE
Leukocytes,Ua: NEGATIVE
Nitrite: NEGATIVE
Specific Gravity, Urine: <=1.005 — AB (ref 1.000–1.030)
Total Protein, Urine: NEGATIVE
Urine Glucose: NEGATIVE
Urobilinogen, UA: 0.2 (ref 0.0–1.0)
pH: 6.5 (ref 5.0–8.0)

## 2024-02-19 LAB — TSH: TSH: 1.04 u[IU]/mL (ref 0.35–5.50)

## 2024-02-19 MED ORDER — LOSARTAN POTASSIUM 25 MG PO TABS: 50.0000 mg | ORAL_TABLET | Freq: Every day | ORAL | 3 refills | Status: DC

## 2024-02-19 NOTE — Assessment & Plan Note (Signed)
 We discussed age appropriate health related issues, including available/recomended screening tests and vaccinations. We discussed a need for adhering to healthy diet and exercise. Labs were ordered to be later reviewed . All questions were answered. Cardiac CT scan for calcium  scoring option offered 1/20, 2/22 Colon was due 2022 - pt declined. Will sch Cologuard PSA w/Urology and here

## 2024-02-19 NOTE — Assessment & Plan Note (Signed)
Not taking Losartan

## 2024-02-19 NOTE — Progress Notes (Signed)
 Subjective:  Patient ID: [name], male    DOB: [birth year]  Age: 78 y.o. MRN: [id number]  CC: Annual Exam   HPI [name] presents for a well exam  Outpatient Medications Prior to Visit  Medication Sig Dispense Refill   atorvastatin  (LIPITOR) 40 MG tablet Take 1 tablet (40 mg total) by mouth daily. 90 tablet 3   Multiple Vitamin (MULTIVITAMIN) tablet Take 1 tablet by mouth 2 (two) times daily.      triazolam  (HALCION ) 0.25 MG tablet TAKE 1 TABLET BY MOUTH AT BEDTIME IF NEEDED FOR SLEEP. 30 tablet 2   losartan  (COZAAR ) 25 MG tablet TAKE ONE TABLET BY MOUTH DAILY 90 tablet 3   aspirin EC 81 MG tablet Take one tablet by mouth two times a day. (Patient not taking: Reported on 02/19/2024)     Cholecalciferol (VITAMIN D3) 1000 units CAPS Take by mouth. (Patient not taking: Reported on 02/19/2024)     tamsulosin (FLOMAX) 0.4 MG CAPS capsule Take 1 capsule (0.4 mg total) by mouth in the morning and at bedtime. (Patient not taking: Reported on 02/19/2024) 60 capsule 5   No facility-administered medications prior to visit.    ROS: Review of Systems  Constitutional:  Negative for appetite change, fatigue and unexpected weight change.  HENT:  Negative for congestion, nosebleeds, sneezing, sore throat and trouble swallowing.   Eyes:  Negative for itching and visual disturbance.  Respiratory:  Negative for cough.   Cardiovascular:  Negative for chest pain, palpitations and leg swelling.  Gastrointestinal:  Negative for abdominal distention, blood in stool, diarrhea and nausea.  Genitourinary:  Negative for frequency and hematuria.  Musculoskeletal:  Negative for back pain, gait problem, joint swelling and neck pain.  Skin:  Negative for rash.  Neurological:  Negative for dizziness, tremors, speech difficulty and weakness.  Psychiatric/Behavioral:  Negative for agitation, dysphoric mood and sleep disturbance. The patient is not nervous/anxious.     Objective:  BP 138/80   Pulse 69    Temp 98.2 F (36.8 C) (Oral)   Ht 5' 10 (1.778 m)   Wt 192 lb (87.1 kg)   SpO2 99%   BMI 27.55 kg/m   BP Readings from Last 3 Encounters:  02/19/24 138/80  04/08/23 (!) 156/99  03/18/23 137/85    Wt Readings from Last 3 Encounters:  02/19/24 192 lb (87.1 kg)  04/08/23 196 lb (88.9 kg)  03/11/23 199 lb 12.8 oz (90.6 kg)    Physical Exam Constitutional:      General: He is not in acute distress.    Appearance: He is well-developed.     Comments: NAD  Eyes:     Conjunctiva/sclera: Conjunctivae normal.     Pupils: Pupils are equal, round, and reactive to light.  Neck:     Thyroid : No thyromegaly.     Vascular: No JVD.  Cardiovascular:     Rate and Rhythm: Normal rate and regular rhythm.     Heart sounds: Normal heart sounds. No murmur heard.    No friction rub. No gallop.  Pulmonary:     Effort: Pulmonary effort is normal. No respiratory distress.     Breath sounds: Normal breath sounds. No wheezing or rales.  Chest:     Chest wall: No tenderness.  Abdominal:     General: Bowel sounds are normal. There is no distension.     Palpations: Abdomen is soft. There is no mass.     Tenderness: There is no abdominal tenderness. There is  no guarding or rebound.  Musculoskeletal:        General: No tenderness. Normal range of motion.     Cervical back: Normal range of motion.  Lymphadenopathy:     Cervical: No cervical adenopathy.  Skin:    General: Skin is warm and dry.     Findings: No rash.  Neurological:     Mental Status: He is alert and oriented to person, place, and time.     Cranial Nerves: No cranial nerve deficit.     Motor: No abnormal muscle tone.     Coordination: Coordination normal.     Gait: Gait normal.     Deep Tendon Reflexes: Reflexes are normal and symmetric.  Psychiatric:        Behavior: Behavior normal.        Thought Content: Thought content normal.        Judgment: Judgment normal.     Lab Results  Component Value Date   WBC 7.8  02/19/2024   HGB 14.9 02/19/2024   HCT 46.0 02/19/2024   PLT 216.0 02/19/2024   GLUCOSE 89 02/19/2024   CHOL 112 02/19/2024   TRIG 53.0 02/19/2024   HDL 44.00 02/19/2024   LDLCALC 57 02/19/2024   ALT 16 02/19/2024   AST 20 02/19/2024   NA 140 02/19/2024   K 4.5 02/19/2024   CL 102 02/19/2024   CREATININE 0.99 02/19/2024   BUN 15 02/19/2024   CO2 20 02/19/2024   TSH 1.04 02/19/2024   PSA 1.11 02/19/2024   HGBA1C 6.4 10/03/2014    CT ANGIO CHEST AORTA W/ & OR WO/CM & GATING ([place] ONLY) Result Date: 04/21/2023 CLINICAL DATA:  Aortic aneurysm without rupture EXAM: CT ANGIOGRAPHY CHEST WITH CONTRAST TECHNIQUE: Multidetector CT imaging of the chest was performed using the standard protocol during bolus administration of intravenous contrast. Multiplanar CT image reconstructions and MIPs were obtained to evaluate the vascular anatomy. RADIATION DOSE REDUCTION: This exam was performed according to the departmental dose-optimization program which includes automated exposure control, adjustment of the mA and/or kV according to patient size and/or use of iterative reconstruction technique. CONTRAST:  75mL OMNIPAQUE  IOHEXOL  350 MG/ML SOLN COMPARISON:  Cardiac CT scan 02/18/2023 FINDINGS: Cardiovascular: The aortic root is normal in caliber at 3.7 cm measured at the sinuses of Valsalva. No effacement of the sino-tubular junction. Mild fusiform aneurysmal dilation of the ascending thoracic aorta with a maximal diameter of 4.4 cm. Conventional 3 vessel arch anatomy. No dissection. No significant calcifications. The main pulmonary artery is normal in caliber. The heart is normal in size. Calcified atherosclerotic plaque is present along the coronary arteries. No pericardial effusion. Mediastinum/Nodes: No enlarged mediastinal, hilar, or axillary lymph nodes. Thyroid  gland, trachea, and esophagus demonstrate no significant findings. Lungs/Pleura: Multiple small calcified and noncalcified pulmonary  nodules. The largest individual nodule that is noncalcified measures 5 mm in the posteromedial aspect of the left lower lobe (image 120 series 6). Other nodules are smaller and/or calcified. No focal airspace infiltrate, pleural effusion or pneumothorax. Upper Abdomen: No acute abnormality. Musculoskeletal: No chest wall abnormality. No acute or significant osseous findings. Review of the MIP images confirms the above findings. IMPRESSION: 1. 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 Recommend annual imaging followup by CTA or MRA. This recommendation follows 2010 ACCF/AHA/AATS/ACR/ASA/SCA/SCAI/SIR/STS/SVM Guidelines for the Diagnosis and Management of Patients with Thoracic Aortic Disease. Circulation. 2010; 121: Z733-z630. Aortic aneurysm NOS (ICD10-I71.9) 2. Coronary artery atherosclerotic vascular calcifications. 3. Scattered small pulmonary nodules.  No follow-up needed if patient is low-risk (and has no known or suspected primary neoplasm). Non-contrast chest CT can be considered in 12 months if patient is high-risk. This recommendation follows the consensus statement: Guidelines for Management of Incidental Pulmonary Nodules Detected on CT Images: From the Fleischner Society 2017; Radiology 2017; 284:228-243. Electronically Signed   By: Wilkie Lent M.D.   On: 04/21/2023 09:45    Assessment & Plan:   Problem List Items Addressed This Visit     BPH (benign prostatic hyperplasia)   Relevant Orders   PSA (Completed)   HTN (hypertension)   Not taking Losartan       Relevant Medications   losartan  (COZAAR ) 25 MG tablet   Other Relevant Orders   TSH (Completed)   CBC with Differential/Platelet (Completed)   Well adult exam - Primary   We discussed age appropriate health related issues, including available/recomended screening tests and vaccinations. We discussed a need for adhering to healthy diet and exercise. Labs were ordered to be later reviewed . All  questions were answered. Cardiac CT scan for calcium  scoring option offered 1/20, 2/22 Colon was due 2022 - pt declined. Will sch Cologuard PSA w/Urology and here      Relevant Orders   TSH (Completed)   Urinalysis (Completed)   CBC with Differential/Platelet (Completed)   Lipid panel (Completed)   PSA (Completed)   Comprehensive metabolic panel with GFR (Completed)      Meds ordered this encounter  Medications   losartan  (COZAAR ) 25 MG tablet    Sig: Take 2 tablets (50 mg total) by mouth daily.    Dispense:  180 tablet    Refill:  3      Follow-up: Return in about 6 months (around 08/21/2024) for a follow-up visit.  Marolyn Noel, MD

## 2024-02-20 ENCOUNTER — Other Ambulatory Visit (HOSPITAL_COMMUNITY): Payer: Self-pay

## 2024-02-20 ENCOUNTER — Ambulatory Visit: Payer: Self-pay | Admitting: Internal Medicine

## 2024-02-20 LAB — COMPREHENSIVE METABOLIC PANEL WITH GFR
ALT: 16 U/L (ref 0–53)
AST: 20 U/L (ref 0–37)
Albumin: 4.5 g/dL (ref 3.5–5.2)
Alkaline Phosphatase: 96 U/L (ref 39–117)
BUN: 15 mg/dL (ref 6–23)
CO2: 20 meq/L (ref 19–32)
Calcium: 9.3 mg/dL (ref 8.4–10.5)
Chloride: 102 meq/L (ref 96–112)
Creatinine, Ser: 0.99 mg/dL (ref 0.40–1.50)
GFR: 71.91 mL/min (ref >60.00)
Glucose, Bld: 89 mg/dL (ref 70–99)
Potassium: 4.5 meq/L (ref 3.5–5.1)
Sodium: 140 meq/L (ref 135–145)
Total Bilirubin: 1.1 mg/dL (ref 0.2–1.2)
Total Protein: 7.2 g/dL (ref 6.0–8.3)

## 2024-02-20 LAB — LIPID PANEL
Cholesterol: 112 mg/dL (ref 0–200)
HDL: 44 mg/dL (ref >39.00)
LDL Cholesterol: 57 mg/dL (ref 0–99)
NonHDL: 67.84
Total CHOL/HDL Ratio: 3
Triglycerides: 53 mg/dL (ref 0.0–149.0)
VLDL: 10.6 mg/dL (ref 0.0–40.0)

## 2024-03-17 ENCOUNTER — Other Ambulatory Visit (HOSPITAL_COMMUNITY): Payer: Self-pay | Admitting: Urology

## 2024-03-17 DIAGNOSIS — Z8551 Personal history of malignant neoplasm of bladder: Secondary | ICD-10-CM

## 2024-03-17 DIAGNOSIS — R102 Pelvic and perineal pain: Secondary | ICD-10-CM

## 2024-03-18 ENCOUNTER — Ambulatory Visit (HOSPITAL_COMMUNITY)
Admission: RE | Admit: 2024-03-18 | Discharge: 2024-03-18 | Disposition: A | Discharge status: Home / Self Care | Source: Ambulatory Visit | Attending: Urology | Admitting: Urology

## 2024-03-18 ENCOUNTER — Ambulatory Visit (HOSPITAL_COMMUNITY)
Admission: RE | Admit: 2024-03-18 | Discharge: 2024-03-18 | Disposition: A | Discharge status: Home / Self Care | Source: Ambulatory Visit | Attending: Cardiology | Admitting: Cardiology

## 2024-03-18 DIAGNOSIS — R102 Pelvic and perineal pain: Secondary | ICD-10-CM | POA: Insufficient documentation

## 2024-03-18 DIAGNOSIS — Z8551 Personal history of malignant neoplasm of bladder: Secondary | ICD-10-CM | POA: Insufficient documentation

## 2024-03-18 DIAGNOSIS — I7121 Aneurysm of the ascending aorta, without rupture: Secondary | ICD-10-CM | POA: Diagnosis not present

## 2024-03-18 DIAGNOSIS — N400 Enlarged prostate without lower urinary tract symptoms: Secondary | ICD-10-CM | POA: Diagnosis not present

## 2024-03-18 DIAGNOSIS — I2510 Atherosclerotic heart disease of native coronary artery without angina pectoris: Secondary | ICD-10-CM | POA: Diagnosis not present

## 2024-03-18 MED ORDER — IOHEXOL 350 MG/ML SOLN
100.0000 mL | Freq: Once | INTRAVENOUS | Status: AC | PRN
  Administered 2024-03-18: 100 mL via INTRAVENOUS

## 2024-03-24 ENCOUNTER — Ambulatory Visit: Payer: Self-pay | Admitting: Cardiovascular Disease

## 2024-03-24 DIAGNOSIS — K080 Exfoliation of teeth due to systemic causes: Secondary | ICD-10-CM | POA: Diagnosis not present

## 2024-03-24 DIAGNOSIS — I7121 Aneurysm of the ascending aorta, without rupture: Secondary | ICD-10-CM

## 2024-04-09 ENCOUNTER — Encounter: Payer: Self-pay | Admitting: Cardiovascular Disease

## 2024-04-09 ENCOUNTER — Ambulatory Visit: Attending: Cardiovascular Disease | Admitting: Cardiovascular Disease

## 2024-04-09 VITALS — BP 120/80 | HR 55 | Ht 70.5 in | Wt 193.2 lb

## 2024-04-09 DIAGNOSIS — I10 Essential (primary) hypertension: Secondary | ICD-10-CM

## 2024-04-09 DIAGNOSIS — I2510 Atherosclerotic heart disease of native coronary artery without angina pectoris: Secondary | ICD-10-CM | POA: Diagnosis not present

## 2024-04-09 DIAGNOSIS — E782 Mixed hyperlipidemia: Secondary | ICD-10-CM

## 2024-04-09 DIAGNOSIS — I7121 Aneurysm of the ascending aorta, without rupture: Secondary | ICD-10-CM

## 2024-04-09 MED ORDER — LOSARTAN POTASSIUM 50 MG PO TABS: 50.0000 mg | ORAL_TABLET | Freq: Every day | ORAL | 3 refills | Status: AC

## 2024-04-09 NOTE — Assessment & Plan Note (Signed)
 Patient has elevated coronary calcium  score.  He is on appropriate medications with aspirin and atorvastatin .  He has no anginal symptoms or functional limitation.

## 2024-04-09 NOTE — Assessment & Plan Note (Signed)
 We discussed options.  I advised that he increase his losartan  to 50 mg at bedtime and give it a few weeks to see how he adjusts.  If he is having excessive fatigue or other side effects, I asked him to reach out and we can try another strategy.  He may be someone who tolerates to very low doses of medications better than dose titration of a single drug.  I think it is important to control his blood pressure pretty tightly in the context of ascending aortic aneurysm.

## 2024-04-09 NOTE — Patient Instructions (Signed)
 Medication Instructions:  START Losartan  50 mg once daily at bedtime  *If you need a refill on your cardiac medications before your next appointment, please call your pharmacy*  Lab Work: None ordered today. If you have labs (blood work) drawn today and your tests are completely normal, you will receive your results only by: MyChart Message (if you have MyChart) OR A paper copy in the mail If you have any lab test that is abnormal or we need to change your treatment, we will call you to review the results.  Testing/Procedures: Your provider has requested that you have a gated CTA chest in 1 year prior to your follow-up appointment. Non-Cardiac CT Angiography (CTA), is a special type of CT scan that uses a computer to produce multi-dimensional views of major blood vessels throughout the body. In CT angiography, a contrast material is injected through an IV to help visualize the blood vessels   Follow-Up: At [facility], you and your health needs are our priority.  As part of our continuing mission to provide you with exceptional heart care, our providers are all part of one team.  This team includes your primary Cardiologist (physician) and Advanced Practice Providers or APPs (Physician Assistants and Nurse Practitioners) who all work together to provide you with the care you need, when you need it.  Your next appointment:   1 year(s)  Provider:   Ozell Fell, MD

## 2024-04-09 NOTE — Progress Notes (Signed)
 Cardiology Office Note:    Date:  04/09/2024   ID:  [name], DOB 10-29-1945, MRN [id number]  PCP:  Garald Karlynn GAILS, MD   [facility] Providers Cardiologist:  None     Referring MD: Garald Karlynn GAILS, MD   Chief Complaint  Patient presents with   Hypertension    History of Present Illness:    [name] is a 78 y.o. male with a hx of elevated coronary calcium  score and dilated ascending aorta.  I saw him initially 1 year ago and noted that he had a calcium  score of 590, placing him in the 60th percentile.  He also was found to have a 4.8 cm ascending thoracic aortic aneurysm.  A CTA study was performed and demonstrated fusiform aneurysm  dilatation of the ascending aorta at 4.4 cm.  A follow-up CTA was just recently completed September 4 and the ascending aorta now measures 4.5 cm, stable from previous.  The patient is here alone today.  He has been feeling well.  He has a lot of trouble sleeping at night and he takes his blood pressure sometimes in the middle of the night.  He notes that it is usually elevated when he is awake in the early morning hours.  Oftentimes a systolic reading is 140-1 150s.  During the day he reports good blood pressure control.  He takes losartan  25 mg at bedtime.  He tried to increase it but he felt tired the following day so he went back to the 25 mg dose.  He has not taken any other antihypertensive medications.  He feels like he is tolerating his other medications well with no significant side effects.  He denies chest pain, chest pressure, or shortness of breath.  He is able to jog on the treadmill for about 30 minutes with no problems.  He goes to [employer].   Current Medications: Current Meds  Medication Sig   aspirin EC 81 MG tablet Take one tablet by mouth two times a day.   atorvastatin  (LIPITOR) 40 MG tablet Take 1 tablet (40 mg total) by mouth daily.   losartan  (COZAAR ) 50 MG tablet Take 1 tablet (50 mg total) by  mouth at bedtime.   Multiple Vitamin (MULTIVITAMIN) tablet Take 1 tablet by mouth 2 (two) times daily.    [DISCONTINUED] losartan  (COZAAR ) 25 MG tablet Take 2 tablets (50 mg total) by mouth daily. (Patient taking differently: Take 50 mg by mouth daily. Per patient taking 1/2 tablet in the morning and 1 tablet at night.)     Allergies:   Patient has no known allergies.   Past Medical History:  Diagnosis Date   Degeneration of lumbar or lumbosacral intervertebral disc    Hemorrhoids    Hypertension    mild   Pure hypercholesterolemia    Unspecified hearing loss     ROS:   Please see the history of present illness.    All other systems reviewed and are negative.  EKGs/Labs/Other Studies Reviewed:    The following studies were reviewed today:     EKG:   EKG Interpretation Date/Time:  Friday April 09 2024 15:23:18 EDT Ventricular Rate:  55 PR Interval:  182 QRS Duration:  108 QT Interval:  414 QTC Calculation: 396 R Axis:   -71  Text Interpretation: Sinus bradycardia Left anterior fascicular block When compared with ECG of 11-Mar-2023 14:10, No significant change was found Confirmed by Wonda Sharper [phone]) on 04/09/2024 3:32:22 PM  Recent Labs: 02/19/2024: ALT 16; BUN 15; Creatinine, Ser 0.99; Hemoglobin 14.9; Platelets 216.0; Potassium 4.5; Sodium 140; TSH 1.04  Recent Lipid Panel    Component Value Date/Time   CHOL 112 02/19/2024 1611   TRIG 53.0 02/19/2024 1611   HDL 44.00 02/19/2024 1611   CHOLHDL 3 02/19/2024 1611   VLDL 10.6 02/19/2024 1611   LDLCALC 57 02/19/2024 1611     Risk Assessment/Calculations:                Physical Exam:    VS:  BP 120/80   Pulse (!) 55   Ht 5' 10.5 (1.791 m)   Wt 193 lb 3.2 oz (87.6 kg)   SpO2 97%   BMI 27.33 kg/m     Wt Readings from Last 3 Encounters:  04/09/24 193 lb 3.2 oz (87.6 kg)  02/19/24 192 lb (87.1 kg)  04/08/23 196 lb (88.9 kg)     GEN:  Well nourished, well developed in no acute  distress HEENT: Normal NECK: No JVD; No carotid bruits LYMPHATICS: No lymphadenopathy CARDIAC: RRR, no murmurs, rubs, gallops RESPIRATORY:  Clear to auscultation without rales, wheezing or rhonchi  ABDOMEN: Soft, non-tender, non-distended MUSCULOSKELETAL:  No edema; No deformity  SKIN: Warm and dry NEUROLOGIC:  Alert and oriented x 3 PSYCHIATRIC:  Normal affect   Assessment & Plan Primary hypertension We discussed options.  I advised that he increase his losartan  to 50 mg at bedtime and give it a few weeks to see how he adjusts.  If he is having excessive fatigue or other side effects, I asked him to reach out and we can try another strategy.  He may be someone who tolerates to very low doses of medications better than dose titration of a single drug.  I think it is important to control his blood pressure pretty tightly in the context of ascending aortic aneurysm. Atherosclerosis of native coronary artery of native heart without angina pectoris Patient has elevated coronary calcium  score.  He is on appropriate medications with aspirin and atorvastatin .  He has no anginal symptoms or functional limitation. Mixed hyperlipidemia Treated with atorvastatin  40 mg daily.  LDL cholesterol is 57. Aneurysm of ascending aorta without rupture Patient's ascending aorta measured 4.5 cm on most recent CTA study.  Will repeat in 1 year.     Medication Adjustments/Labs and Tests Ordered: Current medicines are reviewed at length with the patient today.  Concerns regarding medicines are outlined above.  Orders Placed This Encounter  Procedures   CT ANGIO CHEST AORTA W/ & OR WO/CM & GATING (HEART & VASCULAR TOWER ONLY)   EKG 12-Lead   Meds ordered this encounter  Medications   losartan  (COZAAR ) 50 MG tablet    Sig: Take 1 tablet (50 mg total) by mouth at bedtime.    Dispense:  90 tablet    Refill:  3    Patient Instructions  Medication Instructions:  START Losartan  50 mg once daily at  bedtime  *If you need a refill on your cardiac medications before your next appointment, please call your pharmacy*  Lab Work: None ordered today. If you have labs (blood work) drawn today and your tests are completely normal, you will receive your results only by: MyChart Message (if you have MyChart) OR A paper copy in the mail If you have any lab test that is abnormal or we need to change your treatment, we will call you to review the results.  Testing/Procedures: Your provider has requested that you have  a gated CTA chest in 1 year prior to your follow-up appointment. Non-Cardiac CT Angiography (CTA), is a special type of CT scan that uses a computer to produce multi-dimensional views of major blood vessels throughout the body. In CT angiography, a contrast material is injected through an IV to help visualize the blood vessels   Follow-Up: At [facility], you and your health needs are our priority.  As part of our continuing mission to provide you with exceptional heart care, our providers are all part of one team.  This team includes your primary Cardiologist (physician) and Advanced Practice Providers or APPs (Physician Assistants and Nurse Practitioners) who all work together to provide you with the care you need, when you need it.  Your next appointment:   1 year(s)  Provider:   Ozell Fell, MD      Signed, Ozell Fell, MD  04/09/2024 3:55 PM    [facility]

## 2024-04-09 NOTE — Assessment & Plan Note (Signed)
 Treated with atorvastatin  40 mg daily.  LDL cholesterol is 57.

## 2024-04-09 NOTE — Assessment & Plan Note (Signed)
 Patient's ascending aorta measured 4.5 cm on most recent CTA study.  Will repeat in 1 year.

## 2024-04-20 ENCOUNTER — Other Ambulatory Visit: Payer: Self-pay | Admitting: Internal Medicine

## 2024-05-24 DIAGNOSIS — K080 Exfoliation of teeth due to systemic causes: Secondary | ICD-10-CM | POA: Diagnosis not present

## 2024-06-08 DIAGNOSIS — N401 Enlarged prostate with lower urinary tract symptoms: Secondary | ICD-10-CM | POA: Diagnosis not present

## 2024-06-08 DIAGNOSIS — R3912 Poor urinary stream: Secondary | ICD-10-CM | POA: Diagnosis not present

## 2024-06-08 DIAGNOSIS — R3914 Feeling of incomplete bladder emptying: Secondary | ICD-10-CM | POA: Diagnosis not present

## 2024-08-19 ENCOUNTER — Other Ambulatory Visit: Payer: Self-pay | Admitting: Internal Medicine
